# Patient Record
Sex: Female | Born: 1935 | Race: Black or African American | Hispanic: No | Smoking: Never smoker
Health system: Southern US, Community
[De-identification: ages and names within clinical notes are randomized; demographics above are authoritative.]

## PROBLEM LIST (undated history)

## (undated) DIAGNOSIS — K219 Gastro-esophageal reflux disease without esophagitis: Secondary | ICD-10-CM

## (undated) DIAGNOSIS — D649 Anemia, unspecified: Secondary | ICD-10-CM

## (undated) DIAGNOSIS — I10 Essential (primary) hypertension: Secondary | ICD-10-CM

## (undated) DIAGNOSIS — K922 Gastrointestinal hemorrhage, unspecified: Secondary | ICD-10-CM

## (undated) DIAGNOSIS — I4891 Unspecified atrial fibrillation: Secondary | ICD-10-CM

## (undated) DIAGNOSIS — M109 Gout, unspecified: Secondary | ICD-10-CM

## (undated) DIAGNOSIS — K7460 Unspecified cirrhosis of liver: Secondary | ICD-10-CM

---

## 2017-09-29 ENCOUNTER — Other Ambulatory Visit: Payer: Self-pay

## 2017-09-29 ENCOUNTER — Emergency Department (HOSPITAL_COMMUNITY): Payer: Medicare Other

## 2017-09-29 ENCOUNTER — Encounter (HOSPITAL_COMMUNITY): Payer: Self-pay

## 2017-09-29 ENCOUNTER — Inpatient Hospital Stay (HOSPITAL_COMMUNITY)
Admission: EM | Admit: 2017-09-29 | Discharge: 2017-10-02 | DRG: 194 | Disposition: A | Discharge status: Home / Self Care | Payer: Medicare Other | Attending: Internal Medicine | Admitting: Internal Medicine

## 2017-09-29 DIAGNOSIS — M109 Gout, unspecified: Secondary | ICD-10-CM | POA: Diagnosis present

## 2017-09-29 DIAGNOSIS — D649 Anemia, unspecified: Secondary | ICD-10-CM | POA: Diagnosis not present

## 2017-09-29 DIAGNOSIS — Z8711 Personal history of peptic ulcer disease: Secondary | ICD-10-CM | POA: Diagnosis not present

## 2017-09-29 DIAGNOSIS — I4891 Unspecified atrial fibrillation: Secondary | ICD-10-CM | POA: Diagnosis present

## 2017-09-29 DIAGNOSIS — K219 Gastro-esophageal reflux disease without esophagitis: Secondary | ICD-10-CM | POA: Diagnosis present

## 2017-09-29 DIAGNOSIS — N189 Chronic kidney disease, unspecified: Secondary | ICD-10-CM

## 2017-09-29 DIAGNOSIS — I129 Hypertensive chronic kidney disease with stage 1 through stage 4 chronic kidney disease, or unspecified chronic kidney disease: Secondary | ICD-10-CM | POA: Diagnosis present

## 2017-09-29 DIAGNOSIS — I509 Heart failure, unspecified: Secondary | ICD-10-CM

## 2017-09-29 DIAGNOSIS — R0600 Dyspnea, unspecified: Secondary | ICD-10-CM

## 2017-09-29 DIAGNOSIS — N184 Chronic kidney disease, stage 4 (severe): Secondary | ICD-10-CM | POA: Diagnosis present

## 2017-09-29 DIAGNOSIS — E8770 Fluid overload, unspecified: Secondary | ICD-10-CM | POA: Diagnosis present

## 2017-09-29 DIAGNOSIS — Y95 Nosocomial condition: Secondary | ICD-10-CM | POA: Diagnosis present

## 2017-09-29 DIAGNOSIS — D509 Iron deficiency anemia, unspecified: Secondary | ICD-10-CM | POA: Diagnosis present

## 2017-09-29 DIAGNOSIS — E785 Hyperlipidemia, unspecified: Secondary | ICD-10-CM | POA: Diagnosis present

## 2017-09-29 DIAGNOSIS — R0902 Hypoxemia: Secondary | ICD-10-CM | POA: Diagnosis present

## 2017-09-29 DIAGNOSIS — I10 Essential (primary) hypertension: Secondary | ICD-10-CM | POA: Diagnosis not present

## 2017-09-29 DIAGNOSIS — I340 Nonrheumatic mitral (valve) insufficiency: Secondary | ICD-10-CM | POA: Diagnosis not present

## 2017-09-29 DIAGNOSIS — J189 Pneumonia, unspecified organism: Principal | ICD-10-CM

## 2017-09-29 DIAGNOSIS — I4892 Unspecified atrial flutter: Secondary | ICD-10-CM | POA: Diagnosis present

## 2017-09-29 DIAGNOSIS — K7460 Unspecified cirrhosis of liver: Secondary | ICD-10-CM | POA: Diagnosis present

## 2017-09-29 DIAGNOSIS — R609 Edema, unspecified: Secondary | ICD-10-CM | POA: Diagnosis not present

## 2017-09-29 DIAGNOSIS — N179 Acute kidney failure, unspecified: Secondary | ICD-10-CM | POA: Diagnosis present

## 2017-09-29 DIAGNOSIS — I350 Nonrheumatic aortic (valve) stenosis: Secondary | ICD-10-CM | POA: Diagnosis not present

## 2017-09-29 HISTORY — DX: Gout, unspecified: M10.9

## 2017-09-29 HISTORY — DX: Gastrointestinal hemorrhage, unspecified: K92.2

## 2017-09-29 HISTORY — DX: Unspecified cirrhosis of liver: K74.60

## 2017-09-29 HISTORY — DX: Essential (primary) hypertension: I10

## 2017-09-29 HISTORY — DX: Anemia, unspecified: D64.9

## 2017-09-29 HISTORY — DX: Gastro-esophageal reflux disease without esophagitis: K21.9

## 2017-09-29 LAB — LIPASE, BLOOD: Lipase: 33 U/L (ref 11–51)

## 2017-09-29 LAB — BRAIN NATRIURETIC PEPTIDE: B Natriuretic Peptide: 1096.3 pg/mL — ABNORMAL HIGH (ref 0.0–100.0)

## 2017-09-29 LAB — I-STAT TROPONIN, ED: TROPONIN I, POC: 0.02 ng/mL (ref 0.00–0.08)

## 2017-09-29 LAB — CBC
HCT: 21.9 % — ABNORMAL LOW (ref 36.0–46.0)
HEMOGLOBIN: 7 g/dL — AB (ref 12.0–15.0)
MCH: 30.2 pg (ref 26.0–34.0)
MCHC: 32 g/dL (ref 30.0–36.0)
MCV: 94.4 fL (ref 78.0–100.0)
Platelets: 192 10*3/uL (ref 150–400)
RBC: 2.32 MIL/uL — AB (ref 3.87–5.11)
RDW: 15.1 % (ref 11.5–15.5)
WBC: 11.1 10*3/uL — AB (ref 4.0–10.5)

## 2017-09-29 LAB — URINALYSIS, ROUTINE W REFLEX MICROSCOPIC
BILIRUBIN URINE: NEGATIVE
Glucose, UA: NEGATIVE mg/dL
Hgb urine dipstick: NEGATIVE
Ketones, ur: NEGATIVE mg/dL
Leukocytes, UA: NEGATIVE
NITRITE: NEGATIVE
Protein, ur: NEGATIVE mg/dL
SPECIFIC GRAVITY, URINE: 1.008 (ref 1.005–1.030)
pH: 6 (ref 5.0–8.0)

## 2017-09-29 LAB — BASIC METABOLIC PANEL
ANION GAP: 10 (ref 5–15)
BUN: 22 mg/dL — ABNORMAL HIGH (ref 6–20)
CALCIUM: 9.1 mg/dL (ref 8.9–10.3)
CO2: 22 mmol/L (ref 22–32)
Chloride: 106 mmol/L (ref 101–111)
Creatinine, Ser: 1.66 mg/dL — ABNORMAL HIGH (ref 0.44–1.00)
GFR, EST AFRICAN AMERICAN: 31 mL/min — AB (ref >60)
GFR, EST NON AFRICAN AMERICAN: 27 mL/min — AB (ref >60)
Glucose, Bld: 135 mg/dL — ABNORMAL HIGH (ref 65–99)
Potassium: 4.3 mmol/L (ref 3.5–5.1)
SODIUM: 138 mmol/L (ref 135–145)

## 2017-09-29 LAB — HEPATIC FUNCTION PANEL
ALT: 13 U/L — ABNORMAL LOW (ref 14–54)
AST: 31 U/L (ref 15–41)
Albumin: 3.1 g/dL — ABNORMAL LOW (ref 3.5–5.0)
Alkaline Phosphatase: 80 U/L (ref 38–126)
BILIRUBIN DIRECT: 0.2 mg/dL (ref 0.1–0.5)
Indirect Bilirubin: 0.3 mg/dL (ref 0.3–0.9)
Total Bilirubin: 0.5 mg/dL (ref 0.3–1.2)
Total Protein: 7.2 g/dL (ref 6.5–8.1)

## 2017-09-29 LAB — ABO/RH: ABO/RH(D): O POS

## 2017-09-29 LAB — APTT: aPTT: 37 seconds — ABNORMAL HIGH (ref 24–36)

## 2017-09-29 LAB — RETICULOCYTES
RBC.: 2.29 MIL/uL — ABNORMAL LOW (ref 3.87–5.11)
Retic Count, Absolute: 73.3 10*3/uL (ref 19.0–186.0)
Retic Ct Pct: 3.2 % — ABNORMAL HIGH (ref 0.4–3.1)

## 2017-09-29 LAB — POC OCCULT BLOOD, ED: Fecal Occult Bld: NEGATIVE

## 2017-09-29 LAB — PROTIME-INR
INR: 1.24
PROTHROMBIN TIME: 15.5 s — AB (ref 11.4–15.2)

## 2017-09-29 LAB — PREPARE RBC (CROSSMATCH)

## 2017-09-29 LAB — I-STAT CG4 LACTIC ACID, ED: LACTIC ACID, VENOUS: 1.8 mmol/L (ref 0.5–1.9)

## 2017-09-29 MED ORDER — VANCOMYCIN HCL IN DEXTROSE 750-5 MG/150ML-% IV SOLN: 750.0000 mg | INTRAVENOUS | Status: DC

## 2017-09-29 MED ORDER — METOPROLOL TARTRATE 12.5 MG HALF TABLET
12.5000 mg | ORAL_TABLET | Freq: Two times a day (BID) | ORAL | Status: DC
  Administered 2017-09-30 – 2017-10-02 (×5): 12.5 mg via ORAL
  Filled 2017-09-29 (×6): qty 1

## 2017-09-29 MED ORDER — METHYLPREDNISOLONE SODIUM SUCC 125 MG IJ SOLR
125.0000 mg | Freq: Once | INTRAMUSCULAR | Status: AC
  Administered 2017-09-29: 125 mg via INTRAVENOUS
  Filled 2017-09-29: qty 2

## 2017-09-29 MED ORDER — PANTOPRAZOLE SODIUM 40 MG PO TBEC
40.0000 mg | DELAYED_RELEASE_TABLET | Freq: Two times a day (BID) | ORAL | Status: DC
  Administered 2017-09-30 – 2017-10-02 (×5): 40 mg via ORAL
  Filled 2017-09-29 (×5): qty 1

## 2017-09-29 MED ORDER — IOPAMIDOL (ISOVUE-370) INJECTION 76%
INTRAVENOUS | Status: AC
  Filled 2017-09-29: qty 100

## 2017-09-29 MED ORDER — ATORVASTATIN CALCIUM 40 MG PO TABS
40.0000 mg | ORAL_TABLET | Freq: Every day | ORAL | Status: DC
  Administered 2017-09-30 – 2017-10-01 (×2): 40 mg via ORAL
  Filled 2017-09-29 (×2): qty 1

## 2017-09-29 MED ORDER — ALBUTEROL SULFATE (2.5 MG/3ML) 0.083% IN NEBU: 2.5000 mg | INHALATION_SOLUTION | RESPIRATORY_TRACT | Status: DC | PRN

## 2017-09-29 MED ORDER — IOPAMIDOL (ISOVUE-300) INJECTION 61%: 75.0000 mL | Freq: Once | INTRAVENOUS | Status: DC | PRN

## 2017-09-29 MED ORDER — IOPAMIDOL (ISOVUE-370) INJECTION 76%
75.0000 mL | Freq: Once | INTRAVENOUS | Status: AC | PRN
Start: 2017-09-29 — End: 2017-09-29
  Administered 2017-09-29: 75 mL via INTRAVENOUS

## 2017-09-29 MED ORDER — PIPERACILLIN SOD-TAZOBACTAM SO 2.25 (2-0.25) G IV SOLR
2.2750 g | Freq: Four times a day (QID) | INTRAVENOUS | Status: DC
  Filled 2017-09-29 (×2): qty 2.27

## 2017-09-29 MED ORDER — SODIUM CHLORIDE 0.9 % IV SOLN
500.0000 mg | Freq: Once | INTRAVENOUS | Status: AC
  Administered 2017-09-29: 500 mg via INTRAVENOUS
  Filled 2017-09-29: qty 500

## 2017-09-29 MED ORDER — ENOXAPARIN SODIUM 30 MG/0.3ML ~~LOC~~ SOLN
30.0000 mg | SUBCUTANEOUS | Status: DC
  Administered 2017-09-29 – 2017-10-01 (×3): 30 mg via SUBCUTANEOUS
  Filled 2017-09-29 (×3): qty 0.3

## 2017-09-29 MED ORDER — PIPERACILLIN-TAZOBACTAM 3.375 G IVPB 30 MIN
3.3750 g | Freq: Once | INTRAVENOUS | Status: AC
  Administered 2017-09-29: 3.375 g via INTRAVENOUS
  Filled 2017-09-29: qty 50

## 2017-09-29 MED ORDER — SODIUM CHLORIDE 0.9 % IV SOLN
10.0000 mL/h | Freq: Once | INTRAVENOUS | Status: AC
Start: 2017-09-29 — End: 2017-09-29
  Administered 2017-09-29: 10 mL/h via INTRAVENOUS

## 2017-09-29 MED ORDER — AMLODIPINE BESYLATE 5 MG PO TABS
5.0000 mg | ORAL_TABLET | Freq: Every day | ORAL | Status: DC
  Administered 2017-09-29 – 2017-10-01 (×3): 5 mg via ORAL
  Filled 2017-09-29 (×3): qty 1

## 2017-09-29 MED ORDER — PIPERACILLIN-TAZOBACTAM IN DEX 2-0.25 GM/50ML IV SOLN
2.2500 g | Freq: Four times a day (QID) | INTRAVENOUS | Status: DC
  Administered 2017-09-30: 2.25 g via INTRAVENOUS
  Filled 2017-09-29 (×4): qty 50

## 2017-09-29 MED ORDER — VANCOMYCIN HCL IN DEXTROSE 1-5 GM/200ML-% IV SOLN
1000.0000 mg | Freq: Once | INTRAVENOUS | Status: AC
  Administered 2017-09-29: 1000 mg via INTRAVENOUS
  Filled 2017-09-29: qty 200

## 2017-09-29 MED ORDER — SODIUM CHLORIDE 0.9 % IV BOLUS (SEPSIS)
1000.0000 mL | Freq: Once | INTRAVENOUS | Status: AC
  Administered 2017-09-29: 1000 mL via INTRAVENOUS

## 2017-09-29 MED ORDER — FUROSEMIDE 10 MG/ML IJ SOLN
40.0000 mg | Freq: Once | INTRAMUSCULAR | Status: AC
  Administered 2017-09-29: 40 mg via INTRAVENOUS
  Filled 2017-09-29: qty 4

## 2017-09-29 MED ORDER — ALBUTEROL SULFATE (2.5 MG/3ML) 0.083% IN NEBU
5.0000 mg | INHALATION_SOLUTION | Freq: Once | RESPIRATORY_TRACT | Status: AC
  Administered 2017-09-29: 5 mg via RESPIRATORY_TRACT
  Filled 2017-09-29: qty 6

## 2017-09-29 MED ORDER — IPRATROPIUM BROMIDE 0.02 % IN SOLN
0.5000 mg | Freq: Once | RESPIRATORY_TRACT | Status: AC
  Administered 2017-09-29: 0.5 mg via RESPIRATORY_TRACT
  Filled 2017-09-29: qty 2.5

## 2017-09-29 NOTE — ED Notes (Signed)
Per RN,Oscar hold lab draw at this time.

## 2017-09-29 NOTE — ED Triage Notes (Signed)
Pt reports feeling weak for 1 week. Dyspnea with exertion. Denies chest pain or nausea.Denies numbness or tingling. No unilateral symptoms or facial droop. A&Ox4.

## 2017-09-29 NOTE — ED Provider Notes (Signed)
[facility]-EMERGENCY DEPT Provider Note   CSN: [id number] Arrival date & time: 09/29/17  1221     History   Chief Complaint Chief Complaint  Patient presents with  . Weakness    HPI Nichole Padilla is a 83 y.o. female here presenting with shortness of breath with exertion, cough, weakness.  Patient states that for the last week or so she has been having shortness of breath with exertion and associated with some nausea.  Patient just feels weak all over as well.  Patient has some noncombative cough and has some wheezing as well.  Denies any actual fevers.  Of note, patient was [facility] for symptomatic anemia and had a colonoscopy that was unremarkable.  She was thought to have bleeding secondary to colchicine use for her gout.  She denies any melena currently and denies any NSAID use.   The history is provided by the patient.    History reviewed. No pertinent past medical history.  There are no active problems to display for this patient.   No past surgical history on file.  OB History    No data available       Home Medications    Prior to Admission medications   Not on File    Family History History reviewed. No pertinent family history.  Social History Social History   Tobacco Use  . Smoking status: Not on file  Substance Use Topics  . Alcohol use: Not on file  . Drug use: Not on file     Allergies   Patient has no known allergies.   Review of Systems Review of Systems  Respiratory: Positive for shortness of breath.   Neurological: Positive for weakness.  All other systems reviewed and are negative.    Physical Exam Updated Vital Signs BP 139/66 (BP Location: Left Arm)   Pulse 95   Temp 98.8 F (37.1 C) (Oral)   Resp 16   Ht 5\' 3"  (1.6 m)   Wt 61.2 kg (135 lb)   SpO2 96%   BMI 23.91 kg/m   Physical Exam  Constitutional: She is oriented to person, place, and time.  Chronically ill,  pale   HENT:  Head: Normocephalic.  Mouth/Throat: Oropharynx is clear and moist.  Eyes: Conjunctivae and EOM are normal. Pupils are equal, round, and reactive to light.  Neck: Normal range of motion. Neck supple.  Cardiovascular: Normal rate, regular rhythm and normal heart sounds.  Pulmonary/Chest:  Wheezing throughout, worse on R side   Abdominal: Soft. Bowel sounds are normal. She exhibits no distension. There is no tenderness.  Musculoskeletal:  1+ edema bilaterally   Neurological: She is alert and oriented to person, place, and time.  Skin: Skin is warm.  Psychiatric: She has a normal mood and affect.  Nursing note and vitals reviewed.    ED Treatments / Results  Labs (all labs ordered are listed, but only abnormal results are displayed) Labs Reviewed  BASIC METABOLIC PANEL - Abnormal; Notable for the following components:      Result Value   Glucose, Bld 135 (*)    BUN 22 (*)    Creatinine, Ser 1.66 (*)    GFR calc non Af Amer 27 (*)    GFR calc Af Amer 31 (*)    All other components within normal limits  CBC - Abnormal; Notable for the following components:   WBC 11.1 (*)    RBC 2.32 (*)    Hemoglobin  7.0 (*)    HCT 21.9 (*)    All other components within normal limits  URINALYSIS, ROUTINE W REFLEX MICROSCOPIC  HEPATIC FUNCTION PANEL  LIPASE, BLOOD  VITAMIN B12  FOLATE  IRON AND TIBC  FERRITIN  RETICULOCYTES  I-STAT TROPONIN, ED  POC OCCULT BLOOD, ED  TYPE AND SCREEN  PREPARE RBC (CROSSMATCH)    EKG  EKG Interpretation  Date/Time:  Sunday September 29 2017 14:02:12 EST Ventricular Rate:  105 PR Interval:    QRS Duration: 56 QT Interval:  440 QTC Calculation: 582 R Axis:   65 Text Interpretation:  Atrial fibrillation Low voltage, precordial leads Borderline T abnormalities, diffuse leads Prolonged QT interval Baseline wander in lead(s) I III aVR aVL aVF V5 No previous ECGs available Confirmed by Richardean CanalYao, Minie Roadcap H [phone]([id number]) on 09/29/2017 5:03:40 PM        Radiology No results found.  Procedures Procedures (including critical care time)  CRITICAL CARE Performed by: Richardean Canalavid H Tanveer Dobberstein   Total critical care time: 30 minutes  Critical care time was exclusive of separately billable procedures and treating other patients.  Critical care was necessary to treat or prevent imminent or life-threatening deterioration.  Critical care was time spent personally by me on the following activities: development of treatment plan with patient and/or surrogate as well as nursing, discussions with consultants, evaluation of patient's response to treatment, examination of patient, obtaining history from patient or surrogate, ordering and performing treatments and interventions, ordering and review of laboratory studies, ordering and review of radiographic studies, pulse oximetry and re-evaluation of patient's condition.   Medications Ordered in ED Medications  sodium chloride 0.9 % bolus 1,000 mL (not administered)  albuterol (PROVENTIL) (2.5 MG/3ML) 0.083% nebulizer solution 5 mg (not administered)  ipratropium (ATROVENT) nebulizer solution 0.5 mg (not administered)  methylPREDNISolone sodium succinate (SOLU-MEDROL) 125 mg/2 mL injection 125 mg (not administered)  0.9 %  sodium chloride infusion (not administered)     Initial Impression / Assessment and Plan / ED Course  I have reviewed the triage vital signs and the nursing notes.  Pertinent labs & imaging results that were available during my care of the patient were reviewed by me and considered in my medical decision making (see chart for details).     Nichole Padilla is a 83 y.o. female here with shortness of breath. Recently admitted for anemia and had nl colonoscopy recently. Consider symptomatic anemia vs COPD vs pneumonia.   8:05 PM CXR showed multifocal pneumonia. She does have some coughing but no fevers. CTA showed no obvious PE, but there is multifocal pneumonia with pleural effusions.  BNP 1000. WBC 13, lactate nl. Cr 1.6, no baseline. Given lasix, vanc/zosyn for HCAP. Hg 7.0, ordered 1 U PRBC, occ negative, anemia panel sent.    Final Clinical Impressions(s) / ED Diagnoses   Final diagnoses:  None    ED Discharge Orders    None       Charlynne PanderYao, Kindel Rochefort Hsienta, MD 09/29/17 2007

## 2017-09-29 NOTE — H&P (Addendum)
History and Physical    Nichole Padilla ZOX:[id number] DOB: May 24, 1927 DOA: 09/29/2017  PCP: Patient, No Pcp Per Patient coming from: Home  Chief Complaint: Short of breath  HPI: Nichole Padilla is a 83 y.o. female with medical history significant of hypertension, gout and recent hospitalization in [place] for symptomatic anemia from suspected upper GI source who presents with 1 week of worsening exertional dyspnea, cough and fatigue. Patient states that for the past 3 weeks, she has experienced self-described virus with cough productive of "cream-colored" sputum, wheezing, chills, pleurisy and fatigue. She denies chest pain or chest tightness. Denies nausea, vomiting, diarrhea, urinary symptoms. She states that she has felt better over the last week, with improvement in her "viral symptoms," however the shortness of breath has worsened. She also notes mild increased swelling in her lower extremities, but she does admit to chronic lower extremity edema. She is a lifelong nonsmoker. No sick contacts at home. She was brought in by her daughter and grandson. Patient's daughter is her primary caretaker and organizes the patient's medications. Nichole Padilla ambulates without assistance at home.  In regards to recent hospitalization, the patient reports that she was admitted to [facility] approximately one month ago for a short period of time for apparent symptomatic anemia. The exact duration of the hospitalization is unclear, however the patient states that it was less than 1 day. She received 2 units of packed red blood cells along with upper endoscopy and colonoscopy, both of which were negative per the patient. Capsule endoscopy was also performed, which was also reportedly negative.  ED Course: In the ED, patient afebrile, mildly tachycardic and normotensive, saturating comfortably on room air. Labs notable for WBC 11.1, Hgb 7.0, platelets 192, BUN 22, CR 1.66, BNP 10 96, initial troponin 0.02. UA  wholly unremarkable. FOBT negative for occult blood. Chest x-ray showed right upper and right middle lobe opacities concerning for multifocal pneumonia. CT PE was obtained, which was negative for PE, but did demonstrate multifocal groundglass opacities, most prominent in the right upper right middle lobes with small bilateral pleural effusions right greater than left. Patient was given 1 unit of packed red blood cells, empiric antibiotics, Lasix IV 1 and nebulizer treatment prior to admission to the floor.  Review of Systems: As per HPI otherwise 10 point review of systems negative.   Past Medical History:  Diagnosis Date  . Anemia   . Gout   . Hypertension   . Upper GI bleed     History reviewed. No pertinent surgical history.   reports that  has never smoked. she has never used smokeless tobacco. Her alcohol and drug histories are not on file.  No Known Allergies  History reviewed. No pertinent family history.  Prior to Admission medications   Medication Sig Start Date End Date Taking? Authorizing Provider  amLODipine (NORVASC) 5 MG tablet Take 5 mg daily by mouth.   Yes [provider]  Apoaequorin (PREVAGEN EXTRA STRENGTH) 20 MG CAPS Take 20 mg daily by mouth.   Yes [provider]  Cholecalciferol (VITAMIN D3) 2000 units TABS Take 1 tablet daily by mouth.   Yes [provider]  furosemide (LASIX) 20 MG tablet Take 20 mg daily as needed by mouth for fluid or edema.   Yes [provider]  losartan (COZAAR) 100 MG tablet Take 100 mg daily by mouth.   Yes [provider]  Multiple Vitamin (MULTIVITAMIN WITH MINERALS) TABS tablet Take 1 tablet daily by mouth.   Yes [provider]  omeprazole (PRILOSEC) 20 MG capsule Take 20 mg daily by mouth.   Yes [provider]  traMADol-acetaminophen (ULTRACET) 37.5-325 MG tablet Take 1 tablet 2 (two) times daily as needed by mouth for moderate pain.   Yes [provider]     Physical Exam: Vitals:   09/29/17 1248 09/29/17 1825 09/29/17 2138  BP: 139/66 (!) 169/69 (!) 163/80  Pulse: 95 91 (!) 113  Resp: 16 14 16   Temp: 98.8 F (37.1 C)  98.2 F (36.8 C)  TempSrc: Oral  Oral  SpO2: 96% 100% 98%  Weight: 61.2 kg (135 lb)    Height: 5\' 3"  (1.6 m)      Constitutional: NAD, calm, comfortable Vitals:   09/29/17 1248 09/29/17 1825 09/29/17 2138  BP: 139/66 (!) 169/69 (!) 163/80  Pulse: 95 91 (!) 113  Resp: 16 14 16   Temp: 98.8 F (37.1 C)  98.2 F (36.8 C)  TempSrc: Oral  Oral  SpO2: 96% 100% 98%  Weight: 61.2 kg (135 lb)    Height: 5\' 3"  (1.6 m)     Eyes: PERRL, lids and conjunctivae normal ENMT: Mucous membranes are moist.  Neck: normal, supple, no masses, no thyromegaly Respiratory: inspiratory crackles in the RUL, RML; end-expiratory wheezing in bilateral upper lobes; breath sounds diminished at the bases bilaterally Cardiovascular: Irregular rhythm, tachycardic, no murmurs / rubs / gallops. 1+ pitting edema of BLE L>R Abdomen: no tenderness, no masses palpated. No hepatosplenomegaly. Bowel sounds positive.  Musculoskeletal: no clubbing / cyanosis. No joint deformity upper and lower extremities. Good ROM, no contractures. Normal muscle tone.  Skin: no rashes, lesions, ulcers. No induration Neurologic: CN 2-12 grossly intact. Sensation intact, DTR normal. Strength 5/5 in all 4.  Psychiatric: Normal judgment and insight. Alert and oriented x 3. Normal mood.   Labs on Admission: I have personally reviewed following labs and imaging studies  CBC: Recent Labs  Lab 09/29/17 1453  WBC 11.1*  HGB 7.0*  HCT 21.9*  MCV 94.4  PLT 192   Basic Metabolic Panel: Recent Labs  Lab 09/29/17 1453  NA 138  K 4.3  CL 106  CO2 22  GLUCOSE 135*  BUN 22*  CREATININE 1.66*  CALCIUM 9.1   GFR: Estimated Creatinine Clearance: 19.8 mL/min (A) (by C-G formula based on SCr of 1.66 mg/dL (H)). Liver Function Tests: Recent Labs  Lab 09/29/17 1740   AST 31  ALT 13*  ALKPHOS 80  BILITOT 0.5  PROT 7.2  ALBUMIN 3.1*   Recent Labs  Lab 09/29/17 1740  LIPASE 33   No results for input(s): AMMONIA in the last 168 hours. Coagulation Profile: No results for input(s): INR, PROTIME in the last 168 hours. Cardiac Enzymes: No results for input(s): CKTOTAL, CKMB, CKMBINDEX, TROPONINI in the last 168 hours. BNP (last 3 results) No results for input(s): PROBNP in the last 8760 hours. HbA1C: No results for input(s): HGBA1C in the last 72 hours. CBG: No results for input(s): GLUCAP in the last 168 hours. Lipid Profile: No results for input(s): CHOL, HDL, LDLCALC, TRIG, CHOLHDL, LDLDIRECT in the last 72 hours. Thyroid Function Tests: No results for input(s): TSH, T4TOTAL, FREET4, T3FREE, THYROIDAB in the last 72 hours. Anemia Panel: Recent Labs    09/29/17 1740  RETICCTPCT 3.2*   Urine analysis:    Component Value Date/Time   COLORURINE YELLOW 09/29/2017 1354   APPEARANCEUR CLEAR 09/29/2017 1354   LABSPEC 1.008 09/29/2017 1354   PHURINE 6.0 09/29/2017 1354   GLUCOSEU NEGATIVE  09/29/2017 1354   HGBUR NEGATIVE 09/29/2017 1354   BILIRUBINUR NEGATIVE 09/29/2017 1354   KETONESUR NEGATIVE 09/29/2017 1354   PROTEINUR NEGATIVE 09/29/2017 1354   NITRITE NEGATIVE 09/29/2017 1354   LEUKOCYTESUR NEGATIVE 09/29/2017 1354    Radiological Exams on Admission: Dg Chest 2 View  Result Date: 09/29/2017 CLINICAL DATA:  83 y/o F; history of breast cancer with mastectomy 6 years ago. Patient presents with shortness of breath today. EXAM: CHEST  2 VIEW COMPARISON:  None. FINDINGS: Borderline cardiomegaly. Calcific aortic atherosclerosis. Right mastectomy changes. Mild S-shaped curvature of the spine. Loss of left acromial humeral interval may represent rotator cuff injury. Patchy consolidations in the right upper and lower lobes. Clear left lung. Small right pleural effusion. IMPRESSION: 1. Patchy consolidations in the right upper and lower lobes  and small right effusion, possibly pneumonia or asymmetric pulmonary edema. Follow-up to resolution is recommended to exclude underlying malignancy. 2. Borderline cardiomegaly.  Aortic atherosclerosis. Electronically Signed   By: Mitzi HansenLance  Furusawa-Stratton M.D.   On: 09/29/2017 17:33   Ct Angio Chest Pe W And/or Wo Contrast  Result Date: 09/29/2017 CLINICAL DATA:  83 year old female with dyspnea and weakness. EXAM: CT ANGIOGRAPHY CHEST WITH CONTRAST TECHNIQUE: Multidetector CT imaging of the chest was performed using the standard protocol during bolus administration of intravenous contrast. Multiplanar CT image reconstructions and MIPs were obtained to evaluate the vascular anatomy. CONTRAST:  75mL ISOVUE-370 IOPAMIDOL (ISOVUE-370) INJECTION 76% COMPARISON:  Chest radiograph dated 09/29/2017 FINDINGS: Cardiovascular: Top-normal cardiac size. There is no pericardial effusion. Advanced multi vessel coronary vascular calcification as well as calcification of the mitral annulus. There is moderate atherosclerotic calcification of the thoracic aorta. No aneurysmal dilatation or evidence of dissection. Evaluation of the pulmonary artery is limited due to suboptimal opacification as well as respiratory motion artifact. Patient was initially intended to receive 75 cc of contrast however, she started to complain of burning sensation and pain at the site of the IV and contrast infusion was terminated. No central or large pulmonary artery embolus identified. Mediastinum/Nodes: There is no hilar adenopathy. Subcarinal lymph node measures 18 mm. Multiple top-normal mediastinal lymph nodes noted. Debris within the distal esophagus, likely gastroesophageal reflux or delayed esophageal clearance due to dysmotility. The thyroid gland is grossly unremarkable. Lungs/Pleura: Patchy areas of ground-glass airspace opacity noted in both lungs, right greater than left most consistent with multifocal pneumonia. Clinical correlation and  follow-up to resolution recommended. There are small bilateral pleural effusions, right greater left. No pneumothorax. The central airways are patent. Upper Abdomen: There is irregular appearance of the liver contour suggestive of cirrhosis. The visualized upper abdomen is otherwise unremarkable. Musculoskeletal: There is osteopenia with degenerative changes of the spine. Old-appearing mild T5 compression deformity with central depression. T12 superior endplate compression deformity also appears old. No retropulsed fragment. No definite acute fracture. Review of the MIP images confirms the above findings. IMPRESSION: 1. Limited evaluation of the pulmonary vasculature. No definite CT evidence of central or large pulmonary artery embolus. 2. Multifocal pneumonia clinical correlation and follow-up to resolution recommended. 3. Small bilateral pleural effusions, right greater than left. 4. Coronary vascular calcification and Aortic Atherosclerosis (ICD10-I70.0). Electronically Signed   By: Elgie CollardArash  Radparvar M.D.   On: 09/29/2017 19:33    EKG: Independently reviewed. Heart rate 105. Normal axis. Baseline rhythm appears to be flutter with variable conduction; difficult to assess given poor quality. Does not appear to be atrial fibrillation.  Assessment/Plan Active Problems:   Healthcare-associated pneumonia  HCAP, concern for post viral pneumonia  Bilateral pleural effusions Given recent hospitalization of uncertain duration and impressive CT findings, it is reasonable to treat patient as CAP with MDR risk factors. Patient reports recent respiratory viral syndrome preceding her current symptoms. Some additional evidence of volume overload (bilateral pleural effusions) and BNP elevation. Cannot rule out parapneumonic effusion at this time. Respiratory status is stable at the time of admission. - Broad spectrum empiric Abx - Follow up blood cxs, obtain sputum culture - Consider thoracentesis - Obtain RVP, Strep  pneumo/Legionella urine Ag - Check procalcitonin - Trend trop to peak - Obtain TTE - Daily CBC, trend fever curve  Afib/Flutter CHADS-VASC >3 with minimal history obtained. No known h/o arrhythmia per patient. - Hold on A/C given concern for recent GIB - Start low dose beta blockade - Risk stratification labs: TSH, A1c, FLP - Start statin, holding aspirin as above - TTE as above - Monitor on telemetry - Replete lytes PRN   Normocytic anemia Recent hospitalization for suspected UGIB - s/p 1 U PRBC in ED, FOBT negative - Check iron, B12, folate, hemolysis labs - [facility] - Continue PPI BID - Trend Hgb, transfusion threshold <7  Possible AKI Baseline Cr unknown. U/A showed no abnormalities. - Blood transfusion as above - Would diurese given evidence of hypervolemia - Contrast unfortunately given in ED - Strict I/O, daily weights - Avoid further nephrotoxins - Daily BMP, Mg, PO4  HTN - Continue home amlodipine - IV Lasix as above - Hold losartan given questionable AKI  DVT prophylaxis: Lovenox Code Status: Full Family Communication: Nichole Padilla (daughter), [phone] Disposition Plan: Anticipate 2-5 days for treatment of HCAP Consults called: None Admission status: Inpatient   Marcelo BaldyAdam Ross Kristoph Sattler MD [facility] Pager [phone]  If 7PM-7AM, please contact night-coverage [email or web address] Password [email or web address]  09/29/2017, 10:03 PM

## 2017-09-29 NOTE — Progress Notes (Signed)
Pharmacy Antibiotic Note  Nita SellsConstance Butikofer is a 83 y.o. female with SOB, cough and weakness admitted on 09/29/2017 with pneumonia.  Pharmacy has been consulted for vancomycin dosing.  Plan: Vancomycin 1 gm x1 + 500 mg x1 = 1500 mg load then 750 mg IV q48h for AUC=453 Goal AUC 400-500 Zosyn 3.375 Gm x1 then 2.25 Gm IV q6h (rx adjusted for renal function) Daily Scr F/u cultures/levels  Height: 5\' 3"  (160 cm) Weight: 135 lb (61.2 kg) IBW/kg (Calculated) : 52.4  Temp (24hrs), Avg:98.8 F (37.1 C), Min:98.8 F (37.1 C), Max:98.8 F (37.1 C)  Recent Labs  Lab 09/29/17 1453 09/29/17 1813  WBC 11.1*  --   CREATININE 1.66*  --   LATICACIDVEN  --  1.80    Estimated Creatinine Clearance: 19.8 mL/min (A) (by C-G formula based on SCr of 1.66 mg/dL (H)).    No Known Allergies  Antimicrobials this admission: 11/18 zosyn >>  11/18 vancomycin >>   Dose adjustments this admission:   Microbiology results:  BCx:   UCx:    Sputum:    MRSA PCR:   Thank you for allowing pharmacy to be a part of this patient's care.  Lorenza EvangelistGreen, Halah Whiteside R 09/29/2017 9:26 PM

## 2017-09-29 NOTE — ED Notes (Signed)
Called lab to f/u on the ordered BNP, lab tech indicated they are working on it despite not showing as processing.

## 2017-09-30 ENCOUNTER — Inpatient Hospital Stay (HOSPITAL_COMMUNITY): Payer: Medicare Other

## 2017-09-30 ENCOUNTER — Encounter (HOSPITAL_COMMUNITY): Payer: Self-pay | Admitting: Internal Medicine

## 2017-09-30 DIAGNOSIS — K219 Gastro-esophageal reflux disease without esophagitis: Secondary | ICD-10-CM

## 2017-09-30 DIAGNOSIS — N179 Acute kidney failure, unspecified: Secondary | ICD-10-CM

## 2017-09-30 DIAGNOSIS — I350 Nonrheumatic aortic (valve) stenosis: Secondary | ICD-10-CM

## 2017-09-30 DIAGNOSIS — I10 Essential (primary) hypertension: Secondary | ICD-10-CM

## 2017-09-30 DIAGNOSIS — R609 Edema, unspecified: Secondary | ICD-10-CM

## 2017-09-30 DIAGNOSIS — M109 Gout, unspecified: Secondary | ICD-10-CM | POA: Diagnosis present

## 2017-09-30 DIAGNOSIS — I340 Nonrheumatic mitral (valve) insufficiency: Secondary | ICD-10-CM

## 2017-09-30 DIAGNOSIS — D649 Anemia, unspecified: Secondary | ICD-10-CM | POA: Diagnosis present

## 2017-09-30 DIAGNOSIS — I4891 Unspecified atrial fibrillation: Secondary | ICD-10-CM

## 2017-09-30 DIAGNOSIS — J189 Pneumonia, unspecified organism: Secondary | ICD-10-CM

## 2017-09-30 HISTORY — DX: Gastro-esophageal reflux disease without esophagitis: K21.9

## 2017-09-30 LAB — RESPIRATORY PANEL BY PCR
Adenovirus: NOT DETECTED
Bordetella pertussis: NOT DETECTED
CORONAVIRUS OC43-RVPPCR: NOT DETECTED
Chlamydophila pneumoniae: NOT DETECTED
Coronavirus 229E: NOT DETECTED
Coronavirus HKU1: NOT DETECTED
Coronavirus NL63: NOT DETECTED
INFLUENZA A-RVPPCR: NOT DETECTED
INFLUENZA B-RVPPCR: NOT DETECTED
METAPNEUMOVIRUS-RVPPCR: NOT DETECTED
Mycoplasma pneumoniae: NOT DETECTED
PARAINFLUENZA VIRUS 1-RVPPCR: NOT DETECTED
PARAINFLUENZA VIRUS 2-RVPPCR: NOT DETECTED
PARAINFLUENZA VIRUS 3-RVPPCR: NOT DETECTED
PARAINFLUENZA VIRUS 4-RVPPCR: NOT DETECTED
RESPIRATORY SYNCYTIAL VIRUS-RVPPCR: NOT DETECTED
Rhinovirus / Enterovirus: NOT DETECTED

## 2017-09-30 LAB — EXPECTORATED SPUTUM ASSESSMENT W GRAM STAIN, RFLX TO RESP C

## 2017-09-30 LAB — MAGNESIUM: Magnesium: 1.9 mg/dL (ref 1.7–2.4)

## 2017-09-30 LAB — TROPONIN I
Troponin I: 0.04 ng/mL (ref <0.03)
Troponin I: 0.03 ng/mL (ref <0.03)

## 2017-09-30 LAB — BASIC METABOLIC PANEL
Anion gap: 11 (ref 5–15)
BUN: 21 mg/dL — AB (ref 6–20)
CO2: 20 mmol/L — ABNORMAL LOW (ref 22–32)
Calcium: 8.6 mg/dL — ABNORMAL LOW (ref 8.9–10.3)
Chloride: 102 mmol/L (ref 101–111)
Creatinine, Ser: 1.74 mg/dL — ABNORMAL HIGH (ref 0.44–1.00)
GFR calc Af Amer: 29 mL/min — ABNORMAL LOW (ref >60)
GFR, EST NON AFRICAN AMERICAN: 25 mL/min — AB (ref >60)
Glucose, Bld: 197 mg/dL — ABNORMAL HIGH (ref 65–99)
POTASSIUM: 4.3 mmol/L (ref 3.5–5.1)
Sodium: 133 mmol/L — ABNORMAL LOW (ref 135–145)

## 2017-09-30 LAB — LIPID PANEL
Cholesterol: 169 mg/dL (ref 0–200)
HDL: 49 mg/dL (ref >40)
LDL CALC: 103 mg/dL — AB (ref 0–99)
Total CHOL/HDL Ratio: 3.4 RATIO
Triglycerides: 86 mg/dL (ref <150)
VLDL: 17 mg/dL (ref 0–40)

## 2017-09-30 LAB — FERRITIN: Ferritin: 38 ng/mL (ref 11–307)

## 2017-09-30 LAB — CBC
HEMATOCRIT: 23.4 % — AB (ref 36.0–46.0)
Hemoglobin: 7.8 g/dL — ABNORMAL LOW (ref 12.0–15.0)
MCH: 29.4 pg (ref 26.0–34.0)
MCHC: 33.3 g/dL (ref 30.0–36.0)
MCV: 88.3 fL (ref 78.0–100.0)
Platelets: 175 10*3/uL (ref 150–400)
RBC: 2.65 MIL/uL — AB (ref 3.87–5.11)
RDW: 18.8 % — ABNORMAL HIGH (ref 11.5–15.5)
WBC: 7.4 10*3/uL (ref 4.0–10.5)

## 2017-09-30 LAB — IRON AND TIBC
IRON: 20 ug/dL — AB (ref 28–170)
SATURATION RATIOS: 5 % — AB (ref 10.4–31.8)
TIBC: 402 ug/dL (ref 250–450)
UIBC: 382 ug/dL

## 2017-09-30 LAB — PROCALCITONIN
PROCALCITONIN: 0.11 ng/mL
Procalcitonin: 0.15 ng/mL

## 2017-09-30 LAB — FOLATE: FOLATE: 33 ng/mL (ref >5.9)

## 2017-09-30 LAB — HIV ANTIBODY (ROUTINE TESTING W REFLEX): HIV SCREEN 4TH GENERATION: NONREACTIVE

## 2017-09-30 LAB — EXPECTORATED SPUTUM ASSESSMENT W REFEX TO RESP CULTURE: SPECIAL REQUESTS: NORMAL

## 2017-09-30 LAB — VITAMIN B12: VITAMIN B 12: 1027 pg/mL — AB (ref 180–914)

## 2017-09-30 LAB — TSH: TSH: 1.168 u[IU]/mL (ref 0.350–4.500)

## 2017-09-30 LAB — HEMOGLOBIN A1C
HEMOGLOBIN A1C: 5.9 % — AB (ref 4.8–5.6)
Mean Plasma Glucose: 122.63 mg/dL

## 2017-09-30 LAB — PHOSPHORUS: Phosphorus: 3.7 mg/dL (ref 2.5–4.6)

## 2017-09-30 MED ORDER — LEVALBUTEROL HCL 0.63 MG/3ML IN NEBU: 0.6300 mg | INHALATION_SOLUTION | Freq: Three times a day (TID) | RESPIRATORY_TRACT | Status: DC

## 2017-09-30 MED ORDER — LEVALBUTEROL HCL 0.63 MG/3ML IN NEBU
0.6300 mg | INHALATION_SOLUTION | Freq: Three times a day (TID) | RESPIRATORY_TRACT | Status: DC
  Administered 2017-09-30 – 2017-10-02 (×5): 0.63 mg via RESPIRATORY_TRACT
  Filled 2017-09-30 (×5): qty 3

## 2017-09-30 MED ORDER — ENSURE ENLIVE PO LIQD
237.0000 mL | Freq: Two times a day (BID) | ORAL | Status: DC
  Administered 2017-09-30 – 2017-10-02 (×5): 237 mL via ORAL

## 2017-09-30 MED ORDER — GUAIFENESIN ER 600 MG PO TB12
1200.0000 mg | ORAL_TABLET | Freq: Two times a day (BID) | ORAL | Status: DC
  Administered 2017-10-01 – 2017-10-02 (×4): 1200 mg via ORAL
  Filled 2017-09-30 (×4): qty 2

## 2017-09-30 MED ORDER — PIPERACILLIN-TAZOBACTAM IN DEX 2-0.25 GM/50ML IV SOLN
2.2500 g | Freq: Three times a day (TID) | INTRAVENOUS | Status: DC
  Administered 2017-09-30 – 2017-10-02 (×6): 2.25 g via INTRAVENOUS
  Filled 2017-09-30 (×7): qty 50

## 2017-09-30 NOTE — Progress Notes (Signed)
  Echocardiogram 2D Echocardiogram has been performed.  [name], Nichole Padilla M 09/30/2017, 3:46 PM

## 2017-09-30 NOTE — Evaluation (Signed)
Physical Therapy Evaluation Patient Details Name: Nichole Padilla MRN: [id number] DOB: 01/01/1927 Today's Date: 09/30/2017   History of Present Illness  Nichole Padilla is a 83 y.o. female with medical history significant of hypertension, gout and recent hospitalization in [place] for symptomatic anemia from suspected upper GI source who presents with 1 week of worsening exertional dyspnea, cough and fatigue.   In the ED, mildly tachycardic and normotensive, saturating comfortably on room air. Labs notable for WBC 11.1, Hgb 7.0,  Chest x-ray showed right upper and right middle lobe opacities concerning for multifocal pneumonia. CT PE was obtained, which was negative for PE.   Clinical Impression  The patient is progressing well. Saturation post walking =96% on RA. Pt admitted with above diagnosis. Pt currently with functional limitations due to the deficits listed below (see PT Problem List).  Pt will benefit from skilled PT to increase their independence and safety with mobility to allow discharge to the venue listed below.       Follow Up Recommendations No PT follow up    Equipment Recommendations  None recommended by PT    Recommendations for Other Services       Precautions / Restrictions Precautions Precautions: Fall      Mobility  Bed Mobility Overal bed mobility: Independent                Transfers Overall transfer level: Needs assistance Equipment used: Rolling walker (2 wheeled);None Transfers: Sit to/from Stand Sit to Stand: Min guard            Ambulation/Gait Ambulation/Gait assistance: Min guard Ambulation Distance (Feet): 200 Feet Assistive device: Rolling walker (2 wheeled) Gait Pattern/deviations: Step-through pattern     General Gait Details: ambulated x 6' without RW  Stairs            Wheelchair Mobility    Modified Rankin (Stroke Patients Only)       Balance                                              Pertinent Vitals/Pain Pain Assessment: No/denies pain    Home Living Family/patient expects to be discharged to:: Private residence Living Arrangements: Alone Available Help at Discharge: Family Type of Home: House Home Access: Stairs to enter   Secretary/administratorntrance Stairs-Number of Steps: 3 Home Layout: Able to live on main level with bedroom/bathroom Home Equipment: Walker - 2 wheels;Cane - single point      Prior Function Level of Independence: Independent               Hand Dominance        Extremity/Trunk Assessment   Upper Extremity Assessment Upper Extremity Assessment: Generalized weakness    Lower Extremity Assessment Lower Extremity Assessment: Generalized weakness    Cervical / Trunk Assessment Cervical / Trunk Assessment: Normal  Communication   Communication: No difficulties  Cognition Arousal/Alertness: Awake/alert Behavior During Therapy: WFL for tasks assessed/performed Overall Cognitive Status: Within Functional Limits for tasks assessed                                        General Comments      Exercises     Assessment/Plan    PT Assessment Patient needs continued PT services  PT Problem List Decreased strength;Decreased  activity tolerance;Decreased mobility;Decreased safety awareness       PT Treatment Interventions DME instruction;Gait training;Functional mobility training;Therapeutic activities;Patient/family education    PT Goals (Current goals can be found in the Care Plan section)  Acute Rehab PT Goals Patient Stated Goal: to go home PT Goal Formulation: With patient/family Time For Goal Achievement: 10/14/17 Potential to Achieve Goals: Good    Frequency Min 3X/week   Barriers to discharge        Co-evaluation               AM-PAC PT "6 Clicks" Daily Activity  Outcome Measure Difficulty turning over in bed (including adjusting bedclothes, sheets and blankets)?: None Difficulty moving from lying  on back to sitting on the side of the bed? : None Difficulty sitting down on and standing up from a chair with arms (e.g., wheelchair, bedside commode, etc,.)?: A Little Help needed moving to and from a bed to chair (including a wheelchair)?: A Little Help needed walking in hospital room?: A Little Help needed climbing 3-5 steps with a railing? : A Little 6 Click Score: 20    End of Session   Activity Tolerance: Patient tolerated treatment well Patient left: in chair;with call bell/phone within reach;with family/visitor present Nurse Communication: Mobility status PT Visit Diagnosis: Unsteadiness on feet (R26.81)    Time: [id number] PT Time Calculation (min) (ACUTE ONLY): 18 min   Charges:   PT Evaluation $PT Eval Low Complexity: 1 Low     PT G Codes:        {Lenoard Helbert PT [phone]   Rada HayHill, Fatime Biswell Elizabeth 09/30/2017, 4:31 PM

## 2017-09-30 NOTE — Progress Notes (Signed)
Bilateral lower extremity venous duplex has been completed. Negative for DVT.  09/30/17 9:28 AM Olen CordialGreg Anwita Mencer RVT

## 2017-09-30 NOTE — Progress Notes (Signed)
PROGRESS NOTE    Nichole Padilla  WUJ:[id number] DOB: 04/27/1927 DOA: 09/29/2017 PCP: Patient, No Pcp Per    Brief Narrative:  Patient is a 83 year old female history of hypertension, gout, recent hospitalization and then also symptomatic anemia from suspected upper GI source after treatment for an acute gout flare presented to the ED with 1 week of worsening exertional shortness of breath, cough, fatigue.  CT chest and chest x-ray concerning for right sided multi global pneumonia.  Patient also noted to be in A. fib which is new onset, and acute renal failure.  Patient admitted and placed empirically on IV antibiotics.  2D echo ordered.  Renal ultrasound ordered.  Lower extremity Dopplers done negative.   Assessment & Plan:   Principal Problem:   Healthcare-associated pneumonia Active Problems:   A-fib (HCC)   ARF (acute renal failure) (HCC)   HTN (hypertension)   Anemia   Gout  1 healthcare associated pneumonia Patient admitted with exertional shortness of breath, fatigue, productive cough.  Chest x-ray CT chest negative for PE however concerning for multilobar pneumonia.  Patient has been pancultured and results pending.  But respiratory viral panel negative.  Urine Legionella antigen pending.  Urine pneumococcus antigen pending.  2D echo ordered.  Patient currently afebrile.  Leukocytosis trending down.  Continue empiric IV vancomycin and IV Zosyn.  Add Mucinex and Xopenex nebulizers.  Follow.  2.  New onset atrial fibrillation Likely secondary to problem #1.  Patient with no prior history of atrial fibrillation.CHA2DS2VASC > 3.  Cardiac enzymes minimally elevated.  2D echo pending.  Continue Lopressor for rate control.  Hold on anticoagulation as patient recently evaluated for probable upper GI bleed after treatment for an acute gout flare.  Awaiting endoscopy and hospital records from outside hospital.  Will likely need outpatient follow-up with cardiology.  3.  Acute renal  failure versus chronic kidney disease Labs unavailable on epic as this is patient is likely first, [facility].  Try to obtain hospital records from outside hospital.  Check a urine sodium, urine creatinine.  Renal ultrasound.  Urinalysis is unremarkable.  Hold ARB. Follow.  4.  Gout  #5 hypertension Continue Norvasc and Lopressor.  6.  Hyperlipidemia Continue statin.  7.  Gastroesophageal reflux disease Continue PPI.    DVT prophylaxis: SCDs Code Status: Full Family Communication: Updated patient and daughter at bedside. Disposition Plan: Home once medically stable with clinical improvement with resolution of hypoxia.   Consultants:   None  Procedures:   2D echo 09/30/2017 pending  Lower extremity Dopplers 09/30/2017  Renal ultrasound 09/30/2017  CT angiogram chest 09/29/2017  Chest x-ray 09/29/2017  Antimicrobials:  IV Zosyn 09/29/2018  IV vancomycin 09/29/2018   Subjective: Seen in bed.  Patient states she is feeling better than on admission however not at baseline.  No chest pain.  No bleeding.  Objective: Vitals:   09/30/17 0342 09/30/17 0520 09/30/17 0800 09/30/17 1332  BP: 140/62 134/67  126/74  Pulse: 88 91  84  Resp: 15 16  16   Temp: 98 F (36.7 C) 98 F (36.7 C)  98.5 F (36.9 C)  TempSrc: Oral Oral  Oral  SpO2: 97% 100%  100%  Weight:  93.9 kg (207 lb 0.2 oz) 63.1 kg (139 lb 1.8 oz)   Height:        Intake/Output Summary (Last 24 hours) at 09/30/2017 1717 Last data filed at 09/30/2017 1500 Gross per 24 hour  Intake 1120 ml  Output 600 ml  Net 520 ml  Filed Weights   09/29/17 1248 09/30/17 0520 09/30/17 0800  Weight: 61.2 kg (135 lb) 93.9 kg (207 lb 0.2 oz) 63.1 kg (139 lb 1.8 oz)    Examination:  General exam: Appears calm and comfortable  Respiratory system: Coarse breath sounds on the right greater than left.  No crackles.  No wheezing.  Respiratory effort normal. Cardiovascular system: S1 & S2 heard, RRR. No JVD,  murmurs, rubs, gallops or clicks. No pedal edema. Gastrointestinal system: Abdomen is nondistended, soft and nontender. No organomegaly or masses felt. Normal bowel sounds heard. Central nervous system: Alert and oriented. No focal neurological deficits. Extremities: Symmetric 5 x 5 power. Skin: No rashes, lesions or ulcers Psychiatry: Judgement and insight appear normal. Mood & affect appropriate.     Data Reviewed: I have personally reviewed following labs and imaging studies  CBC: Recent Labs  Lab 09/29/17 1453 09/30/17 0506  WBC 11.1* 7.4  HGB 7.0* 7.8*  HCT 21.9* 23.4*  MCV 94.4 88.3  PLT 192 175   Basic Metabolic Panel: Recent Labs  Lab 09/29/17 1453 09/29/17 2235 09/30/17 0506  NA 138  --  133*  K 4.3  --  4.3  CL 106  --  102  CO2 22  --  20*  GLUCOSE 135*  --  197*  BUN 22*  --  21*  CREATININE 1.66*  --  1.74*  CALCIUM 9.1  --  8.6*  MG  --  1.9  --   PHOS  --  3.7  --    GFR: Estimated Creatinine Clearance: 20.4 mL/min (A) (by C-G formula based on SCr of 1.74 mg/dL (H)). Liver Function Tests: Recent Labs  Lab 09/29/17 1740  AST 31  ALT 13*  ALKPHOS 80  BILITOT 0.5  PROT 7.2  ALBUMIN 3.1*   Recent Labs  Lab 09/29/17 1740  LIPASE 33   No results for input(s): AMMONIA in the last 168 hours. Coagulation Profile: Recent Labs  Lab 09/29/17 2235  INR 1.24   Cardiac Enzymes: Recent Labs  Lab 09/29/17 2235 09/30/17 0506  TROPONINI 0.04* 0.03*   BNP (last 3 results) No results for input(s): PROBNP in the last 8760 hours. HbA1C: Recent Labs    09/29/17 2235  HGBA1C 5.9*   CBG: No results for input(s): GLUCAP in the last 168 hours. Lipid Profile: Recent Labs    09/29/17 2235  CHOL 169  HDL 49  LDLCALC 103*  TRIG 86  CHOLHDL 3.4   Thyroid Function Tests: Recent Labs    09/29/17 2235  TSH 1.168   Anemia Panel: Recent Labs    09/29/17 1740  VITAMINB12 1,027*  FOLATE 33.0  FERRITIN 38  TIBC 402  IRON 20*  RETICCTPCT  3.2*   Sepsis Labs: Recent Labs  Lab 09/29/17 1813 09/29/17 2235 09/30/17 0506  PROCALCITON  --  0.15 0.11  LATICACIDVEN 1.80  --   --     Recent Results (from the past 240 hour(s))  Respiratory Panel by PCR     Status: None   Collection Time: 09/29/17  9:59 PM  Result Value Ref Range Status   Adenovirus NOT DETECTED NOT DETECTED Final   Coronavirus 229E NOT DETECTED NOT DETECTED Final   Coronavirus HKU1 NOT DETECTED NOT DETECTED Final   Coronavirus NL63 NOT DETECTED NOT DETECTED Final   Coronavirus OC43 NOT DETECTED NOT DETECTED Final   Metapneumovirus NOT DETECTED NOT DETECTED Final   Rhinovirus / Enterovirus NOT DETECTED NOT DETECTED Final   Influenza A NOT DETECTED  NOT DETECTED Final   Influenza B NOT DETECTED NOT DETECTED Final   Parainfluenza Virus 1 NOT DETECTED NOT DETECTED Final   Parainfluenza Virus 2 NOT DETECTED NOT DETECTED Final   Parainfluenza Virus 3 NOT DETECTED NOT DETECTED Final   Parainfluenza Virus 4 NOT DETECTED NOT DETECTED Final   Respiratory Syncytial Virus NOT DETECTED NOT DETECTED Final   Bordetella pertussis NOT DETECTED NOT DETECTED Final   Chlamydophila pneumoniae NOT DETECTED NOT DETECTED Final   Mycoplasma pneumoniae NOT DETECTED NOT DETECTED Final    Comment: Performed at [facility], [address] [place], [place] [address]  Culture, sputum-assessment     Status: None   Collection Time: 09/30/17  9:07 AM  Result Value Ref Range Status   Specimen Description SPUTUM  Final   Special Requests Normal  Final   Sputum evaluation   Final    Sputum specimen not acceptable for testing.  Please recollect.   INFORMED FRASER,B @ 1051 ON [id number] BY POTEAT,S   Report Status 09/30/2017 FINAL  Final         Radiology Studies: Dg Chest 2 View  Result Date: 09/29/2017 CLINICAL DATA:  83 y/o F; history of breast cancer with mastectomy 6 years ago. Patient presents with shortness of breath today. EXAM: CHEST  2 VIEW COMPARISON:  None.  FINDINGS: Borderline cardiomegaly. Calcific aortic atherosclerosis. Right mastectomy changes. Mild S-shaped curvature of the spine. Loss of left acromial humeral interval may represent rotator cuff injury. Patchy consolidations in the right upper and lower lobes. Clear left lung. Small right pleural effusion. IMPRESSION: 1. Patchy consolidations in the right upper and lower lobes and small right effusion, possibly pneumonia or asymmetric pulmonary edema. Follow-up to resolution is recommended to exclude underlying malignancy. 2. Borderline cardiomegaly.  Aortic atherosclerosis. Electronically Signed   By: Mitzi Hansen M.D.   On: 09/29/2017 17:33   Ct Angio Chest Pe W And/or Wo Contrast  Result Date: 09/29/2017 CLINICAL DATA:  83 year old female with dyspnea and weakness. EXAM: CT ANGIOGRAPHY CHEST WITH CONTRAST TECHNIQUE: Multidetector CT imaging of the chest was performed using the standard protocol during bolus administration of intravenous contrast. Multiplanar CT image reconstructions and MIPs were obtained to evaluate the vascular anatomy. CONTRAST:  75mL ISOVUE-370 IOPAMIDOL (ISOVUE-370) INJECTION 76% COMPARISON:  Chest radiograph dated 09/29/2017 FINDINGS: Cardiovascular: Top-normal cardiac size. There is no pericardial effusion. Advanced multi vessel coronary vascular calcification as well as calcification of the mitral annulus. There is moderate atherosclerotic calcification of the thoracic aorta. No aneurysmal dilatation or evidence of dissection. Evaluation of the pulmonary artery is limited due to suboptimal opacification as well as respiratory motion artifact. Patient was initially intended to receive 75 cc of contrast however, she started to complain of burning sensation and pain at the site of the IV and contrast infusion was terminated. No central or large pulmonary artery embolus identified. Mediastinum/Nodes: There is no hilar adenopathy. Subcarinal lymph node measures 18 mm.  Multiple top-normal mediastinal lymph nodes noted. Debris within the distal esophagus, likely gastroesophageal reflux or delayed esophageal clearance due to dysmotility. The thyroid gland is grossly unremarkable. Lungs/Pleura: Patchy areas of ground-glass airspace opacity noted in both lungs, right greater than left most consistent with multifocal pneumonia. Clinical correlation and follow-up to resolution recommended. There are small bilateral pleural effusions, right greater left. No pneumothorax. The central airways are patent. Upper Abdomen: There is irregular appearance of the liver contour suggestive of cirrhosis. The visualized upper abdomen is otherwise unremarkable. Musculoskeletal: There is osteopenia with  degenerative changes of the spine. Old-appearing mild T5 compression deformity with central depression. T12 superior endplate compression deformity also appears old. No retropulsed fragment. No definite acute fracture. Review of the MIP images confirms the above findings. IMPRESSION: 1. Limited evaluation of the pulmonary vasculature. No definite CT evidence of central or large pulmonary artery embolus. 2. Multifocal pneumonia clinical correlation and follow-up to resolution recommended. 3. Small bilateral pleural effusions, right greater than left. 4. Coronary vascular calcification and Aortic Atherosclerosis (ICD10-I70.0). Electronically Signed   By: Elgie CollardArash  Radparvar M.D.   On: 09/29/2017 19:33   [place] Renal  Result Date: 09/30/2017 CLINICAL DATA:  Acute renal failure EXAM: RENAL / URINARY TRACT ULTRASOUND COMPLETE COMPARISON:  None. FINDINGS: Right Kidney: Length: 7.3 cm. Echogenicity within normal limits. No mass or hydronephrosis visualized. Trace perinephric fluid is noted about the interpolar lateral aspect of the right kidney. Left Kidney: Length: 8.8 cm. Echogenicity within normal limits. No mass or hydronephrosis visualized. Bladder: Appears normal for degree of bladder distention. Other:  Small right pleural effusion. IMPRESSION: 1. No obstructive uropathy, nephrolithiasis or renal mass. Maintained cortical-medullary distinction of the kidneys. 2. Small right pleural effusion. 3. Nonspecific trace perinephric fluid is identified. Electronically Signed   By: Tollie Ethavid  Kwon M.D.   On: 09/30/2017 15:01        Scheduled Meds: . amLODipine  5 mg Oral Daily  . atorvastatin  40 mg Oral q1800  . enoxaparin (LOVENOX) injection  30 mg Subcutaneous Q24H  . feeding supplement (ENSURE ENLIVE)  237 mL Oral BID BM  . metoprolol tartrate  12.5 mg Oral BID  . pantoprazole  40 mg Oral BID AC   Continuous Infusions: . piperacillin-tazobactam (ZOSYN)  IV Stopped (09/30/17 1642)  . [START ON 10/01/2017] vancomycin       LOS: 1 day    Time spent: 35 minutes    Ramiro Harvestaniel Shamyra Farias, MD [facility] Pager [phone]  If 7PM-7AM, please contact night-coverage [email or web address] Password [email or web address] 09/30/2017, 5:17 PM

## 2017-09-30 NOTE — Care Management Note (Signed)
Case Management Note  Patient Details  Name: Nichole SellsConstance Padilla MRN: [id number] Date of Birth: Nov 29, 1926  Subjective/Objective:                  Anemia and pna  Action/Plan: Date: September 30, 2017 Nichole Padilla, BSN, [place], [place]  [phone] Chart and notes review for patient progress and needs. Will follow for case management and discharge needs. Next review date: [id number]  Expected Discharge Date:                  Expected Discharge Plan:  Home/Self Care  In-House Referral:     Discharge planning Services  CM Consult  Post Acute Care Choice:    Choice offered to:     DME Arranged:    DME Agency:     HH Arranged:    HH Agency:     Status of Service:  In process, will continue to follow  If discussed at Long Length of Stay Meetings, dates discussed:    Additional Comments:  Golda AcreDavis, Bravery Ketcham Lynn, RN 09/30/2017, 9:03 AM

## 2017-10-01 ENCOUNTER — Encounter (HOSPITAL_COMMUNITY): Payer: Self-pay | Admitting: Internal Medicine

## 2017-10-01 DIAGNOSIS — N179 Acute kidney failure, unspecified: Secondary | ICD-10-CM | POA: Clinically undetermined

## 2017-10-01 DIAGNOSIS — N189 Chronic kidney disease, unspecified: Secondary | ICD-10-CM

## 2017-10-01 DIAGNOSIS — K7460 Unspecified cirrhosis of liver: Secondary | ICD-10-CM

## 2017-10-01 HISTORY — DX: Unspecified cirrhosis of liver: K74.60

## 2017-10-01 LAB — SODIUM, URINE, RANDOM: Sodium, Ur: 14 mmol/L

## 2017-10-01 LAB — BASIC METABOLIC PANEL
ANION GAP: 11 (ref 5–15)
ANION GAP: 11 (ref 5–15)
BUN: 36 mg/dL — ABNORMAL HIGH (ref 6–20)
BUN: 40 mg/dL — ABNORMAL HIGH (ref 6–20)
CALCIUM: 8.4 mg/dL — AB (ref 8.9–10.3)
CO2: 22 mmol/L (ref 22–32)
CO2: 22 mmol/L (ref 22–32)
Calcium: 8.4 mg/dL — ABNORMAL LOW (ref 8.9–10.3)
Chloride: 101 mmol/L (ref 101–111)
Chloride: 102 mmol/L (ref 101–111)
Creatinine, Ser: 2.47 mg/dL — ABNORMAL HIGH (ref 0.44–1.00)
Creatinine, Ser: 2.64 mg/dL — ABNORMAL HIGH (ref 0.44–1.00)
GFR, EST AFRICAN AMERICAN: 19 mL/min — AB (ref >60)
GFR, EST AFRICAN AMERICAN: 18 mL/min — AB (ref >60)
GFR, EST NON AFRICAN AMERICAN: 16 mL/min — AB (ref >60)
GFR, EST NON AFRICAN AMERICAN: 15 mL/min — AB (ref >60)
GLUCOSE: 122 mg/dL — AB (ref 65–99)
GLUCOSE: 173 mg/dL — AB (ref 65–99)
POTASSIUM: 4.6 mmol/L (ref 3.5–5.1)
POTASSIUM: 4.2 mmol/L (ref 3.5–5.1)
SODIUM: 135 mmol/L (ref 135–145)
Sodium: 134 mmol/L — ABNORMAL LOW (ref 135–145)

## 2017-10-01 LAB — CBC
HEMATOCRIT: 23.2 % — AB (ref 36.0–46.0)
HEMOGLOBIN: 7.7 g/dL — AB (ref 12.0–15.0)
MCH: 29.4 pg (ref 26.0–34.0)
MCHC: 33.2 g/dL (ref 30.0–36.0)
MCV: 88.5 fL (ref 78.0–100.0)
Platelets: 174 10*3/uL (ref 150–400)
RBC: 2.62 MIL/uL — AB (ref 3.87–5.11)
RDW: 19.3 % — ABNORMAL HIGH (ref 11.5–15.5)
WBC: 15.1 10*3/uL — ABNORMAL HIGH (ref 4.0–10.5)

## 2017-10-01 LAB — CREATININE, URINE, RANDOM: CREATININE, URINE: 114.04 mg/dL

## 2017-10-01 LAB — PROCALCITONIN: PROCALCITONIN: 0.14 ng/mL

## 2017-10-01 LAB — STREP PNEUMONIAE URINARY ANTIGEN: STREP PNEUMO URINARY ANTIGEN: NEGATIVE

## 2017-10-01 LAB — HAPTOGLOBIN: HAPTOGLOBIN: 246 mg/dL — AB (ref 34–200)

## 2017-10-01 LAB — MRSA PCR SCREENING: MRSA BY PCR: NEGATIVE

## 2017-10-01 MED ORDER — SODIUM CHLORIDE 0.9 % IV BOLUS (SEPSIS)
500.0000 mL | Freq: Once | INTRAVENOUS | Status: AC
  Administered 2017-10-01: 500 mL via INTRAVENOUS

## 2017-10-01 MED ORDER — SODIUM CHLORIDE 0.9 % IV SOLN
510.0000 mg | Freq: Once | INTRAVENOUS | Status: AC
  Administered 2017-10-01: 510 mg via INTRAVENOUS
  Filled 2017-10-01: qty 17

## 2017-10-01 MED ORDER — SODIUM CHLORIDE 0.9 % IV SOLN
INTRAVENOUS | Status: DC
Start: 2017-10-01 — End: 2017-10-02
  Administered 2017-10-01: 13:00:00 via INTRAVENOUS

## 2017-10-01 NOTE — Progress Notes (Addendum)
PROGRESS NOTE    Nichole Padilla  ZOX:[id number] DOB: Sep 21, 1927 DOA: 09/29/2017 PCP: Patient, No Pcp Per    Brief Narrative:  Patient is a 83 year old female history of hypertension, gout, recent hospitalization and then also symptomatic anemia from suspected upper GI source after treatment for an acute gout flare presented to the ED with 1 week of worsening exertional shortness of breath, cough, fatigue.  CT chest and chest x-ray concerning for right sided multi global pneumonia.  Patient also noted to be in A. fib which is new onset, and acute renal failure.  Patient admitted and placed empirically on IV antibiotics.  2D echo ordered.  Renal ultrasound ordered.  Lower extremity Dopplers done negative.   Assessment & Plan:   Principal Problem:   Healthcare-associated pneumonia Active Problems:   A-fib (HCC)   ARF (acute renal failure) (HCC)   HTN (hypertension)   Anemia   Gout   GERD (gastroesophageal reflux disease)   HCAP (healthcare-associated pneumonia)  1 healthcare associated pneumonia Patient admitted with exertional shortness of breath, fatigue, productive cough.  Chest x-ray CT chest negative for PE however concerning for multilobar pneumonia.  Patient has been pancultured and results pending.  But respiratory viral panel negative.  Urine Legionella antigen pending.  Urine pneumococcus antigen negative.  MRSA PCR negative.  Patient currently afebrile.  Leukocytosis trending back up.  Discontinue IV vancomycin.  Continue IV Zosyn.  Continue Mucinex and Xopenex nebulizers.  Supportive care.  If continued improvement could transition to oral Augmentin tomorrow 10/02/2017.    2.  New onset atrial fibrillation Likely secondary to problem #1.  Patient with no prior history of atrial fibrillation.CHA2DS2VASC > 3.  Cardiac enzymes minimally elevated.  2D echo with EF of 55-60%, no wall motion abnormalities, left atrium mildly dilated, moderate tricuspid valvular regurgitation.   Currently rate controlled.  Continue Lopressor for rate control.  Hold on anticoagulation as patient recently evaluated for probable upper GI bleed.  Per patient's PCP patient with a history of peptic ulcer disease as well as cirrhosis and not a good anticoagulation candidate and as such we will hold off on anticoagulation.  Will likely need outpatient follow-up with cardiology.  3.  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  Likely secondary to prerenal azotemia.  Studies obtained with a fractional excretion of sodium is 0.23%.  Spoke with patient's PCP Dr.Shah, and it was noted that patient does have a chronic kidney disease stage III/IV baseline creatinine 1.6-1.9. Renal ultrasound negative for hydronephrosis.  Urinalysis unremarkable.  ARB on hold.  Patient with worsening renal function.  Gently hydrate with IV fluids.  Follow.    4.  Gout  #5 hypertension Continue current regimen of Norvasc and Lopressor.  6.  Hyperlipidemia Continue statin.  7.  Gastroesophageal reflux disease/history of peptic ulcer disease Continue PPI.  #8 iron deficiency anemia Last hemoglobin September 16, 2017 was 7.5 per PCP.  Patient status post transfusion 1 unit packed red blood cells hemoglobin currently stable at 7.7.  Continue PPI.  Will give a dose of IV iron.  Follow H&H.    DVT prophylaxis: SCDs Code Status: Full Family Communication: Updated patient and daughter at bedside. Disposition Plan: Home once medically stable with clinical improvement with resolution of hypoxia and improvement in renal function, hopefully in 24-48 hours..   Consultants:   None  Procedures:   2D echo 09/30/2017 pending  Lower extremity  Dopplers 09/30/2017  Renal ultrasound 09/30/2017  CT angiogram chest 09/29/2017  Chest x-ray 09/29/2017  1 unit packed red blood cells  09/30/2017  Antimicrobials:  IV Zosyn 09/29/2018  IV vancomycin 09/29/2018>>>>>>> 10/01/2017   Subjective: Patient sitting on the side of the bed.  States shortness of breath improving however not at baseline.  No chest pain.  Patient states has been having good urine output.    Objective: Vitals:   09/30/17 2109 10/01/17 0521 10/01/17 0743 10/01/17 0746  BP: 131/70 135/64    Pulse: 82 69    Resp: 20 20    Temp: 98.6 F (37 C) 98.5 F (36.9 C)    TempSrc: Oral Oral    SpO2: 97% 98% 96% 96%  Weight:      Height:        Intake/Output Summary (Last 24 hours) at 10/01/2017 1225 Last data filed at 10/01/2017 1202 Gross per 24 hour  Intake 770 ml  Output -  Net 770 ml   Filed Weights   09/29/17 1248 09/30/17 0520 09/30/17 0800  Weight: 61.2 kg (135 lb) 93.9 kg (207 lb 0.2 oz) 63.1 kg (139 lb 1.8 oz)    Examination:  General exam: Appears calm and comfortable  Respiratory system: Coarse breath sounds on the right greater than left.  No crackles.  No wheezing.  Respiratory effort normal. Cardiovascular system: Regular rate and rhythm no murmurs rubs or gallops.  No JVD.  No lower extremity edema.  Gastrointestinal system: Abdomen is soft, nontender, nondistended, positive bowel sounds.  No hepatosplenomegaly.  Central nervous system: Alert and oriented. No focal neurological deficits. Extremities: Symmetric 5 x 5 power. Skin: No rashes, lesions or ulcers Psychiatry: Judgement and insight appear normal. Mood & affect appropriate.     Data Reviewed: I have personally reviewed following labs and imaging studies  CBC: Recent Labs  Lab 09/29/17 1453 09/30/17 0506 10/01/17 0614  WBC 11.1* 7.4 15.1*  HGB 7.0* 7.8* 7.7*  HCT 21.9* 23.4* 23.2*  MCV 94.4 88.3 88.5  PLT 192 175 174   Basic Metabolic Panel: Recent Labs  Lab 09/29/17 1453 09/29/17 2235 09/30/17 0506 10/01/17 0614  NA 138  --  133* 134*  K 4.3  --  4.3 4.6  CL 106  --  102 101  CO2 22  --  20* 22   GLUCOSE 135*  --  197* 122*  BUN 22*  --  21* 36*  CREATININE 1.66*  --  1.74* 2.47*  CALCIUM 9.1  --  8.6* 8.4*  MG  --  1.9  --   --   PHOS  --  3.7  --   --    GFR: Estimated Creatinine Clearance: 14.4 mL/min (A) (by C-G formula based on SCr of 2.47 mg/dL (H)). Liver Function Tests: Recent Labs  Lab 09/29/17 1740  AST 31  ALT 13*  ALKPHOS 80  BILITOT 0.5  PROT 7.2  ALBUMIN 3.1*   Recent Labs  Lab 09/29/17 1740  LIPASE 33   No results for input(s): AMMONIA in the last 168 hours. Coagulation Profile: Recent Labs  Lab 09/29/17 2235  INR 1.24   Cardiac Enzymes: Recent Labs  Lab 09/29/17 2235 09/30/17 0506  TROPONINI 0.04* 0.03*   BNP (last 3 results) No results for input(s): PROBNP in the last 8760 hours. HbA1C: Recent Labs    09/29/17 2235  HGBA1C 5.9*   CBG: No results for input(s): GLUCAP in the last 168 hours. Lipid Profile: Recent Labs  09/29/17 2235  CHOL 169  HDL 49  LDLCALC 103*  TRIG 86  CHOLHDL 3.4   Thyroid Function Tests: Recent Labs    09/29/17 2235  TSH 1.168   Anemia Panel: Recent Labs    09/29/17 1740  VITAMINB12 1,027*  FOLATE 33.0  FERRITIN 38  TIBC 402  IRON 20*  RETICCTPCT 3.2*   Sepsis Labs: Recent Labs  Lab 09/29/17 1813 09/29/17 2235 09/30/17 0506 10/01/17 0614  PROCALCITON  --  0.15 0.11 0.14  LATICACIDVEN 1.80  --   --   --     Recent Results (from the past 240 hour(s))  Blood culture (routine x 2)     Status: None (Preliminary result)   Collection Time: 09/29/17  5:58 PM  Result Value Ref Range Status   Specimen Description BLOOD RIGHT ANTECUBITAL  Final   Special Requests   Final    BOTTLES DRAWN AEROBIC AND ANAEROBIC Blood Culture adequate volume   Culture   Final    NO GROWTH 1 DAY Performed at [facility], [address], [place], [place] [address]    Report Status PENDING  Incomplete  Blood culture (routine x 2)     Status: None (Preliminary result)   Collection Time: 09/29/17   9:42 PM  Result Value Ref Range Status   Specimen Description BLOOD LEFT HAND  Final   Special Requests IN PEDIATRIC BOTTLE Blood Culture adequate volume  Final   Culture   Final    NO GROWTH 1 DAY Performed at [facility], [address] [place], [place] [address]    Report Status PENDING  Incomplete  Respiratory Panel by PCR     Status: None   Collection Time: 09/29/17  9:59 PM  Result Value Ref Range Status   Adenovirus NOT DETECTED NOT DETECTED Final   Coronavirus 229E NOT DETECTED NOT DETECTED Final   Coronavirus HKU1 NOT DETECTED NOT DETECTED Final   Coronavirus NL63 NOT DETECTED NOT DETECTED Final   Coronavirus OC43 NOT DETECTED NOT DETECTED Final   Metapneumovirus NOT DETECTED NOT DETECTED Final   Rhinovirus / Enterovirus NOT DETECTED NOT DETECTED Final   Influenza A NOT DETECTED NOT DETECTED Final   Influenza B NOT DETECTED NOT DETECTED Final   Parainfluenza Virus 1 NOT DETECTED NOT DETECTED Final   Parainfluenza Virus 2 NOT DETECTED NOT DETECTED Final   Parainfluenza Virus 3 NOT DETECTED NOT DETECTED Final   Parainfluenza Virus 4 NOT DETECTED NOT DETECTED Final   Respiratory Syncytial Virus NOT DETECTED NOT DETECTED Final   Bordetella pertussis NOT DETECTED NOT DETECTED Final   Chlamydophila pneumoniae NOT DETECTED NOT DETECTED Final   Mycoplasma pneumoniae NOT DETECTED NOT DETECTED Final    Comment: Performed at [facility], [address] [place], [place] [address]  Culture, sputum-assessment     Status: None   Collection Time: 09/30/17  9:07 AM  Result Value Ref Range Status   Specimen Description SPUTUM  Final   Special Requests Normal  Final   Sputum evaluation   Final    Sputum specimen not acceptable for testing.  Please recollect.   INFORMED FRASER,B @ 1051 ON [id number] BY POTEAT,S   Report Status 09/30/2017 FINAL  Final  MRSA PCR Screening     Status: None   Collection Time: 10/01/17  8:31 AM  Result Value Ref Range Status   MRSA by PCR  NEGATIVE NEGATIVE Final    Comment:        The  GeneXpert MRSA Assay (FDA approved for NASAL specimens only), is one component of a comprehensive MRSA colonization surveillance program. It is not intended to diagnose MRSA infection nor to guide or monitor treatment for MRSA infections.          Radiology Studies: Dg Chest 2 View  Result Date: 09/29/2017 CLINICAL DATA:  83 y/o F; history of breast cancer with mastectomy 6 years ago. Patient presents with shortness of breath today. EXAM: CHEST  2 VIEW COMPARISON:  None. FINDINGS: Borderline cardiomegaly. Calcific aortic atherosclerosis. Right mastectomy changes. Mild S-shaped curvature of the spine. Loss of left acromial humeral interval may represent rotator cuff injury. Patchy consolidations in the right upper and lower lobes. Clear left lung. Small right pleural effusion. IMPRESSION: 1. Patchy consolidations in the right upper and lower lobes and small right effusion, possibly pneumonia or asymmetric pulmonary edema. Follow-up to resolution is recommended to exclude underlying malignancy. 2. Borderline cardiomegaly.  Aortic atherosclerosis. Electronically Signed   By: Mitzi HansenLance  Furusawa-Stratton M.D.   On: 09/29/2017 17:33   Ct Angio Chest Pe W And/or Wo Contrast  Result Date: 09/29/2017 CLINICAL DATA:  83 year old female with dyspnea and weakness. EXAM: CT ANGIOGRAPHY CHEST WITH CONTRAST TECHNIQUE: Multidetector CT imaging of the chest was performed using the standard protocol during bolus administration of intravenous contrast. Multiplanar CT image reconstructions and MIPs were obtained to evaluate the vascular anatomy. CONTRAST:  75mL ISOVUE-370 IOPAMIDOL (ISOVUE-370) INJECTION 76% COMPARISON:  Chest radiograph dated 09/29/2017 FINDINGS: Cardiovascular: Top-normal cardiac size. There is no pericardial effusion. Advanced multi vessel coronary vascular calcification as well as calcification of the mitral annulus. There is moderate  atherosclerotic calcification of the thoracic aorta. No aneurysmal dilatation or evidence of dissection. Evaluation of the pulmonary artery is limited due to suboptimal opacification as well as respiratory motion artifact. Patient was initially intended to receive 75 cc of contrast however, she started to complain of burning sensation and pain at the site of the IV and contrast infusion was terminated. No central or large pulmonary artery embolus identified. Mediastinum/Nodes: There is no hilar adenopathy. Subcarinal lymph node measures 18 mm. Multiple top-normal mediastinal lymph nodes noted. Debris within the distal esophagus, likely gastroesophageal reflux or delayed esophageal clearance due to dysmotility. The thyroid gland is grossly unremarkable. Lungs/Pleura: Patchy areas of ground-glass airspace opacity noted in both lungs, right greater than left most consistent with multifocal pneumonia. Clinical correlation and follow-up to resolution recommended. There are small bilateral pleural effusions, right greater left. No pneumothorax. The central airways are patent. Upper Abdomen: There is irregular appearance of the liver contour suggestive of cirrhosis. The visualized upper abdomen is otherwise unremarkable. Musculoskeletal: There is osteopenia with degenerative changes of the spine. Old-appearing mild T5 compression deformity with central depression. T12 superior endplate compression deformity also appears old. No retropulsed fragment. No definite acute fracture. Review of the MIP images confirms the above findings. IMPRESSION: 1. Limited evaluation of the pulmonary vasculature. No definite CT evidence of central or large pulmonary artery embolus. 2. Multifocal pneumonia clinical correlation and follow-up to resolution recommended. 3. Small bilateral pleural effusions, right greater than left. 4. Coronary vascular calcification and Aortic Atherosclerosis (ICD10-I70.0). Electronically Signed   By: Elgie CollardArash   Radparvar M.D.   On: 09/29/2017 19:33   [place] Renal  Result Date: 09/30/2017 CLINICAL DATA:  Acute renal failure EXAM: RENAL / URINARY TRACT ULTRASOUND COMPLETE COMPARISON:  None. FINDINGS: Right Kidney: Length: 7.3 cm. Echogenicity within normal limits. No mass or hydronephrosis visualized. Trace perinephric fluid is noted about the  interpolar lateral aspect of the right kidney. Left Kidney: Length: 8.8 cm. Echogenicity within normal limits. No mass or hydronephrosis visualized. Bladder: Appears normal for degree of bladder distention. Other: Small right pleural effusion. IMPRESSION: 1. No obstructive uropathy, nephrolithiasis or renal mass. Maintained cortical-medullary distinction of the kidneys. 2. Small right pleural effusion. 3. Nonspecific trace perinephric fluid is identified. Electronically Signed   By: Tollie Ethavid  Kwon M.D.   On: 09/30/2017 15:01        Scheduled Meds: . amLODipine  5 mg Oral Daily  . atorvastatin  40 mg Oral q1800  . enoxaparin (LOVENOX) injection  30 mg Subcutaneous Q24H  . feeding supplement (ENSURE ENLIVE)  237 mL Oral BID BM  . guaiFENesin  1,200 mg Oral BID  . levalbuterol  0.63 mg Nebulization TID  . metoprolol tartrate  12.5 mg Oral BID  . pantoprazole  40 mg Oral BID AC   Continuous Infusions: . sodium chloride    . piperacillin-tazobactam (ZOSYN)  IV Stopped (10/01/17 0732)     LOS: 2 days    Time spent: 35 minutes    Ramiro Harvestaniel Thompson, MD [facility] Pager [phone]  If 7PM-7AM, please contact night-coverage [email or web address] Password [email or web address] 10/01/2017, 12:25 PM

## 2017-10-01 NOTE — Progress Notes (Signed)
Notified pt and pts daughter of the order for foley catheter and bladder scan. Pt asked to get up to have a BM before and daughter asked to speak with the MD before foley placement. MD aware

## 2017-10-01 NOTE — Progress Notes (Signed)
Initial Nutrition Assessment  DOCUMENTATION CODES:   Not applicable  INTERVENTION:   Ensure Enlive po BID, each supplement provides 350 kcal and 20 grams of protein  NUTRITION DIAGNOSIS:   Inadequate oral intake related to decreased appetite as evidenced by per patient/family report.  GOAL:   Patient will meet greater than or equal to 90% of their needs  MONITOR:   PO intake, Supplement acceptance, Weight trends, Labs  REASON FOR ASSESSMENT:   Malnutrition Screening Tool    ASSESSMENT:   Pt with PMH significant for HTN and gout. Recently admitted one month ago for symptomatic anemia from suspect upper GI source. Presents this admission with worsening exertional shortness of breath. Admitted for HCAP with concern for post viral pneumonia and bilateral pleural effusion.    Spoke with pt and daughter at bedside.  Reports having decreased appetite prior to admission. Admits she doesn't eat meals, just picks throughout the day. Daughter states pt eats chicken with greens for breakfast and snacks on bread or other carbohydrates the rest of the day. Pt drinks one Ensure daily. Discussed with pt to try consuming protein with every meal or when she snacks throughout the day. Pt states she has gout and cannot eat red meat, fish, or pork. Provided information of different protein options. Pt amendable to Ensure this hospital stay. Will continue ensure at home.   Weight records are limited. Pt reports a UBW of 145 lb, the last time being at that weight a few months ago. Unable to quantify weight loss time frame at this time given lack of records.   Nutrition-Focused physical exam completed.   Medications reviewed and include: NS @ 100 ml/hr, IV abx Labs reviewed: Na 134 (L) BUN 36 (H) Creatinine 2.47 (H)   NUTRITION - FOCUSED PHYSICAL EXAM:    Most Recent Value  Orbital Region  No depletion  Upper Arm Region  No depletion  Thoracic and Lumbar Region  No depletion  Buccal Region  No  depletion  Temple Region  No depletion  Clavicle Bone Region  No depletion  Clavicle and Acromion Bone Region  No depletion  Scapular Bone Region  No depletion  Dorsal Hand  No depletion  Patellar Region  No depletion  Anterior Thigh Region  No depletion  Posterior Calf Region  No depletion  Edema (RD Assessment)  Mild  Hair  Reviewed  Eyes  Reviewed  Mouth  Reviewed  Skin  Reviewed  Nails  Reviewed       Diet Order:  DIET SOFT Room service appropriate? Yes; Fluid consistency: Thin  EDUCATION NEEDS:   Education needs have been addressed  Skin:  Skin Assessment: Reviewed RN Assessment  Last BM:  PTA  Height:   Ht Readings from Last 1 Encounters:  09/29/17 5\' 3"  (1.6 m)    Weight:   Wt Readings from Last 1 Encounters:  09/30/17 139 lb 1.8 oz (63.1 kg)    Ideal Body Weight:  52.3 kg  BMI:  Body mass index is 24.64 kg/m.  Estimated Nutritional Needs:   Kcal:  1600-1800 kcal/day  Protein:  70-80 g/day  Fluid:  >1.6 L/day    Vanessa Kickarly Amey Hossain RD, LDN Clinical Nutrition Pager # - [phone]

## 2017-10-02 ENCOUNTER — Inpatient Hospital Stay (HOSPITAL_COMMUNITY): Payer: Medicare Other

## 2017-10-02 LAB — BASIC METABOLIC PANEL
ANION GAP: 11 (ref 5–15)
BUN: 38 mg/dL — AB (ref 6–20)
CHLORIDE: 102 mmol/L (ref 101–111)
CO2: 21 mmol/L — AB (ref 22–32)
Calcium: 8.5 mg/dL — ABNORMAL LOW (ref 8.9–10.3)
Creatinine, Ser: 2.42 mg/dL — ABNORMAL HIGH (ref 0.44–1.00)
GFR calc Af Amer: 20 mL/min — ABNORMAL LOW (ref >60)
GFR calc non Af Amer: 17 mL/min — ABNORMAL LOW (ref >60)
GLUCOSE: 121 mg/dL — AB (ref 65–99)
POTASSIUM: 3.9 mmol/L (ref 3.5–5.1)
Sodium: 134 mmol/L — ABNORMAL LOW (ref 135–145)

## 2017-10-02 LAB — CBC
HEMATOCRIT: 26.1 % — AB (ref 36.0–46.0)
HEMOGLOBIN: 8.7 g/dL — AB (ref 12.0–15.0)
MCH: 29.4 pg (ref 26.0–34.0)
MCHC: 33.3 g/dL (ref 30.0–36.0)
MCV: 88.2 fL (ref 78.0–100.0)
Platelets: 191 10*3/uL (ref 150–400)
RBC: 2.96 MIL/uL — ABNORMAL LOW (ref 3.87–5.11)
RDW: 18.2 % — AB (ref 11.5–15.5)
WBC: 14 10*3/uL — ABNORMAL HIGH (ref 4.0–10.5)

## 2017-10-02 LAB — LEGIONELLA PNEUMOPHILA SEROGP 1 UR AG: L. pneumophila Serogp 1 Ur Ag: NEGATIVE

## 2017-10-02 MED ORDER — AMOXICILLIN-POT CLAVULANATE 875-125 MG PO TABS: 1.0000 | ORAL_TABLET | Freq: Two times a day (BID) | ORAL | Status: DC

## 2017-10-02 MED ORDER — LEVALBUTEROL HCL 0.63 MG/3ML IN NEBU
INHALATION_SOLUTION | RESPIRATORY_TRACT | Status: AC
  Administered 2017-10-02: 0.63 mg
  Filled 2017-10-02: qty 3

## 2017-10-02 MED ORDER — AMOXICILLIN-POT CLAVULANATE 875-125 MG PO TABS: 1.0000 | ORAL_TABLET | Freq: Two times a day (BID) | ORAL | 0 refills | Status: AC

## 2017-10-02 MED ORDER — AMLODIPINE BESYLATE 10 MG PO TABS: 10.0000 mg | ORAL_TABLET | Freq: Every day | ORAL | 0 refills | Status: AC

## 2017-10-02 MED ORDER — LEVALBUTEROL HCL 0.63 MG/3ML IN NEBU: 0.6300 mg | INHALATION_SOLUTION | Freq: Four times a day (QID) | RESPIRATORY_TRACT | Status: DC | PRN

## 2017-10-02 MED ORDER — AMLODIPINE BESYLATE 10 MG PO TABS
10.0000 mg | ORAL_TABLET | Freq: Every day | ORAL | Status: DC
  Administered 2017-10-02: 10 mg via ORAL
  Filled 2017-10-02: qty 1

## 2017-10-02 NOTE — Progress Notes (Signed)
Date: October 02, 2017 Chart review for discharge needs:  None found for case management. Patient has no questions concerning post hospital care. 

## 2017-10-02 NOTE — Discharge Summary (Signed)
Physician Discharge Summary  Nita SellsConstance Trew XLK:[id number] DOB: 1926-12-17 DOA: 09/29/2017  PCP: Patient, No Pcp Per  Admit date: 09/29/2017 Discharge date: 10/02/2017  Time spent: 35 minutes  Recommendations for Outpatient Follow-up:  PCP in 1 week, please check Bmet and CBC at FU, please start oral iron at follow-up FU CXR in 4-6weeks  Discharge Diagnoses:  Principal Problem:   Healthcare-associated pneumonia   Iron deficiency anemia   A-fib (HCC)   ARF (acute renal failure) (HCC)   HTN (hypertension)   Anemia   Gout   GERD (gastroesophageal reflux disease)   HCAP (healthcare-associated pneumonia)   Renal failure (ARF), 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   Cirrhosis (HCC)   Discharge Condition: stable  Diet recommendation: low sodium  Filed Weights   09/30/17 0520 09/30/17 0800 10/01/17 1314  Weight: 93.9 kg (207 lb 0.2 oz) 63.1 kg (139 lb 1.8 oz) 66.4 kg (146 lb 6.4 oz)    History of present illness:  [age] year old female with gout, recent hospitalization for symptomatic anemia from upper GI source, admitted with exertional shortness of breath cough and fatigue, chest x-ray on admission notable for multilobar pneumonia, also noted to have acute renal failure  Hospital Course:   1.Healthcare associated pneumonia Patient admitted with exertional shortness of breath, fatigue, productive cough.   -CT chest noted multilobar pneumonia.   -Blood and sputum cultures negative, respiratory virus panel negative  -Treated with broad-spectrum IV anabiotic since subsequently transitioned to oral Augmentin today  -She is clinically improved, weaned off oxygen, will need follow-up chest x-ray in 4-6 weeks  2.  New onset atrial fibrillation -Likely secondary to above pneumonia, on admission she was noted to have atrial fibrillation which self converted to sinus rhythm. She had a 2-D echocardiogram which showed normal ejection fraction, no wall motion abnormalities and mildly  dilated left atrium -Heart rate in the 60-70 range without any rate control agents and in sinus rhythm at discharge -We considered anticoagulation however due to recent hospitalization with upper GI bleed and history of cirrhosis, my partner Dr. Janee Mornhompson discussed with her primary care doctor and she was not felt to be a good anticoagulation candidate, I agree with this. -Recommend a follow-up with cardiology as outpatient and readdress anticoagulation if she does not have further bleeding episodes.  3. 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  Gout -Stable   5 hypertension Continue current regimen of Norvasc and Lopressor. - ARB discontinued , Norvasc dose increased to 10 mg   6.  Hyperlipidemia Continue statin.  7.  Gastroesophageal reflux disease/history of peptic ulcer disease Continue PPI.  #8 iron deficiency anemia Last hemoglobin September 16, 2017 was 7.5 per PCP.   -She was given 1 unit of PRBC this admission  -Hemoglobin stable and improved to 8.7  -Recommended to have CBC checked in 1 week and start oral iron at the time of follow-up    Discharge Exam: Vitals:   10/02/17 0732 10/02/17 0736  BP:    Pulse:    Resp:    Temp:    SpO2: 94% 94%  General: AAOx3 Cardiovascular: S1S2/RRR Respiratory: CTAB  Discharge Instructions   Discharge Instructions    Diet - low sodium heart healthy   Complete by:  As directed    Increase activity slowly   Complete by:  As directed      Discharge Medication List as of 10/02/2017 11:53  AM    START taking these medications   Details  amoxicillin-clavulanate (AUGMENTIN) 875-125 MG tablet Take 1 tablet by mouth every 12 (twelve) hours. For 4days, Starting Wed 10/02/2017, Print      CONTINUE these medications which have CHANGED   Details  amLODipine (NORVASC) 10 MG tablet Take 1 tablet (10 mg total) by mouth daily., Starting Wed 10/02/2017, Print      CONTINUE these medications which have NOT CHANGED   Details  Apoaequorin (PREVAGEN EXTRA STRENGTH) 20 MG CAPS Take 20 mg daily by mouth., Historical Med    Cholecalciferol (VITAMIN D3) 2000 units TABS Take 1 tablet daily by mouth., Historical Med    furosemide (LASIX) 20 MG tablet Take 20 mg daily as needed by mouth for fluid or edema., Historical Med    Multiple Vitamin (MULTIVITAMIN WITH MINERALS) TABS tablet Take 1 tablet daily by mouth., Historical Med    omeprazole (PRILOSEC) 20 MG capsule Take 20 mg daily by mouth., Historical Med    traMADol-acetaminophen (ULTRACET) 37.5-325 MG tablet Take 1 tablet 2 (two) times daily as needed by mouth for moderate pain., Historical Med      STOP taking these medications     losartan (COZAAR) 100 MG tablet        No Known Allergies Follow-up Information    PCP. Schedule an appointment as soon as possible for a visit in 1 week(s).   Why:  Please check Kidney function next week at Follow up           The results of significant diagnostics from this hospitalization (including imaging, microbiology, ancillary and laboratory) are listed below for reference.    Significant Diagnostic Studies: Dg Chest 2 View  Result Date: 10/02/2017 CLINICAL DATA:  Shortness of breath and chest pain EXAM: CHEST  2 VIEW COMPARISON:  09/29/2017 FINDINGS: Cardiac shadow is stable. The lungs are well aerated with persistent infiltrates bilaterally right greater than left. The overall appearance is stable from the prior exam. No new sizable effusion is seen. No bony abnormality is noted.  IMPRESSION: Stable bilateral infiltrates. Electronically Signed   By: Alcide CleverMark  Lukens M.D.   On: 10/02/2017 10:48   Dg Chest 2 View  Result Date: 09/29/2017 CLINICAL DATA:  83 y/o F; history of breast cancer with mastectomy 6 years ago. Patient presents with shortness of breath today. EXAM: CHEST  2 VIEW COMPARISON:  None. FINDINGS: Borderline cardiomegaly. Calcific aortic atherosclerosis. Right mastectomy changes. Mild S-shaped curvature of the spine. Loss of left acromial humeral interval may represent rotator cuff injury. Patchy consolidations in the right upper and lower lobes. Clear left lung. Small right pleural effusion. IMPRESSION: 1. Patchy consolidations in the right upper and lower lobes and small right effusion, possibly pneumonia or asymmetric pulmonary edema. Follow-up to resolution is recommended to exclude underlying malignancy. 2. Borderline cardiomegaly.  Aortic atherosclerosis. Electronically Signed   By: Mitzi HansenLance  Furusawa-Stratton M.D.   On: 09/29/2017 17:33   Ct Angio Chest Pe W And/or Wo Contrast  Result Date: 09/29/2017 CLINICAL DATA:  83 year old female with dyspnea and weakness. EXAM: CT ANGIOGRAPHY CHEST WITH CONTRAST TECHNIQUE: Multidetector CT imaging of the chest was performed using the standard protocol during  bolus administration of intravenous contrast. Multiplanar CT image reconstructions and MIPs were obtained to evaluate the vascular anatomy. CONTRAST:  75mL ISOVUE-370 IOPAMIDOL (ISOVUE-370) INJECTION 76% COMPARISON:  Chest radiograph dated 09/29/2017 FINDINGS: Cardiovascular: Top-normal cardiac size. There is no pericardial effusion. Advanced multi vessel coronary vascular calcification as well as calcification of the mitral annulus. There is moderate atherosclerotic calcification of the thoracic aorta. No aneurysmal dilatation or evidence of dissection. Evaluation of the pulmonary artery is limited due to suboptimal opacification as well as respiratory motion artifact.  Patient was initially intended to receive 75 cc of contrast however, she started to complain of burning sensation and pain at the site of the IV and contrast infusion was terminated. No central or large pulmonary artery embolus identified. Mediastinum/Nodes: There is no hilar adenopathy. Subcarinal lymph node measures 18 mm. Multiple top-normal mediastinal lymph nodes noted. Debris within the distal esophagus, likely gastroesophageal reflux or delayed esophageal clearance due to dysmotility. The thyroid gland is grossly unremarkable. Lungs/Pleura: Patchy areas of ground-glass airspace opacity noted in both lungs, right greater than left most consistent with multifocal pneumonia. Clinical correlation and follow-up to resolution recommended. There are small bilateral pleural effusions, right greater left. No pneumothorax. The central airways are patent. Upper Abdomen: There is irregular appearance of the liver contour suggestive of cirrhosis. The visualized upper abdomen is otherwise unremarkable. Musculoskeletal: There is osteopenia with degenerative changes of the spine. Old-appearing mild T5 compression deformity with central depression. T12 superior endplate compression deformity also appears old. No retropulsed fragment. No definite acute fracture. Review of the MIP images confirms the above findings. IMPRESSION: 1. Limited evaluation of the pulmonary vasculature. No definite CT evidence of central or large pulmonary artery embolus. 2. Multifocal pneumonia clinical correlation and follow-up to resolution recommended. 3. Small bilateral pleural effusions, right greater than left. 4. Coronary vascular calcification and Aortic Atherosclerosis (ICD10-I70.0). Electronically Signed   By: Elgie Collard M.D.   On: 09/29/2017 19:33   US Renal  Result Date: 09/30/2017 CLINICAL DATA:  Acute renal failure EXAM: RENAL / URINARY TRACT ULTRASOUND COMPLETE COMPARISON:  None. FINDINGS: Right Kidney: Length: 7.3 cm.  Echogenicity within normal limits. No mass or hydronephrosis visualized. Trace perinephric fluid is noted about the interpolar lateral aspect of the right kidney. Left Kidney: Length: 8.8 cm. Echogenicity within normal limits. No mass or hydronephrosis visualized. Bladder: Appears normal for degree of bladder distention. Other: Small right pleural effusion. IMPRESSION: 1. No obstructive uropathy, nephrolithiasis or renal mass. Maintained cortical-medullary distinction of the kidneys. 2. Small right pleural effusion. 3. Nonspecific trace perinephric fluid is identified. Electronically Signed   By: Tollie Eth M.D.   On: 09/30/2017 15:01    Microbiology: Recent Results (from the past 240 hour(s))  Blood culture (routine x 2)     Status: None (Preliminary result)   Collection Time: 09/29/17  5:58 PM  Result Value Ref Range Status   Specimen Description BLOOD RIGHT ANTECUBITAL  Final   Special Requests   Final    BOTTLES DRAWN AEROBIC AND ANAEROBIC Blood Culture adequate volume   Culture   Final    NO GROWTH 1 DAY Performed at [facility], [address] [place], [place] [address]    Report Status PENDING  Incomplete  Blood culture (routine x 2)     Status: None (Preliminary result)   Collection Time: 09/29/17  9:42 PM  Result Value Ref Range Status   Specimen Description BLOOD LEFT HAND  Final   Special Requests IN PEDIATRIC BOTTLE  Blood Culture adequate volume  Final   Culture   Final    NO GROWTH 1 DAY Performed at [facility], [address], [place], [place] [address]    Report Status PENDING  Incomplete  Respiratory Panel by PCR     Status: None   Collection Time: 09/29/17  9:59 PM  Result Value Ref Range Status   Adenovirus NOT DETECTED NOT DETECTED Final   Coronavirus 229E NOT DETECTED NOT DETECTED Final   Coronavirus HKU1 NOT DETECTED NOT DETECTED Final   Coronavirus NL63 NOT DETECTED NOT DETECTED Final   Coronavirus OC43 NOT DETECTED NOT DETECTED Final    Metapneumovirus NOT DETECTED NOT DETECTED Final   Rhinovirus / Enterovirus NOT DETECTED NOT DETECTED Final   Influenza A NOT DETECTED NOT DETECTED Final   Influenza B NOT DETECTED NOT DETECTED Final   Parainfluenza Virus 1 NOT DETECTED NOT DETECTED Final   Parainfluenza Virus 2 NOT DETECTED NOT DETECTED Final   Parainfluenza Virus 3 NOT DETECTED NOT DETECTED Final   Parainfluenza Virus 4 NOT DETECTED NOT DETECTED Final   Respiratory Syncytial Virus NOT DETECTED NOT DETECTED Final   Bordetella pertussis NOT DETECTED NOT DETECTED Final   Chlamydophila pneumoniae NOT DETECTED NOT DETECTED Final   Mycoplasma pneumoniae NOT DETECTED NOT DETECTED Final    Comment: Performed at [facility], [address], [place], [place] [address]  Culture, sputum-assessment     Status: None   Collection Time: 09/30/17  9:07 AM  Result Value Ref Range Status   Specimen Description SPUTUM  Final   Special Requests Normal  Final   Sputum evaluation   Final    Sputum specimen not acceptable for testing.  Please recollect.   INFORMED FRASER,B @ 1051 ON [id number] BY POTEAT,S   Report Status 09/30/2017 FINAL  Final  MRSA PCR Screening     Status: None   Collection Time: 10/01/17  8:31 AM  Result Value Ref Range Status   MRSA by PCR NEGATIVE NEGATIVE Final    Comment:        The GeneXpert MRSA Assay (FDA approved for NASAL specimens only), is one component of a comprehensive MRSA colonization surveillance program. It is not intended to diagnose MRSA infection nor to guide or monitor treatment for MRSA infections.      Labs: Basic Metabolic Panel: Recent Labs  Lab 09/29/17 1453 09/29/17 2235 09/30/17 0506 10/01/17 0614 10/01/17 1544 10/02/17 0608  NA 138  --  133* 134* 135 134*  K 4.3  --  4.3 4.6 4.2 3.9  CL 106  --  102 101 102 102  CO2 22  --  20* 22 22 21*  GLUCOSE 135*  --  197* 122* 173* 121*  BUN 22*  --  21* 36* 40* 38*  CREATININE 1.66*  --  1.74* 2.47* 2.64* 2.42*  CALCIUM 9.1   --  8.6* 8.4* 8.4* 8.5*  MG  --  1.9  --   --   --   --   PHOS  --  3.7  --   --   --   --    Liver Function Tests: Recent Labs  Lab 09/29/17 1740  AST 31  ALT 13*  ALKPHOS 80  BILITOT 0.5  PROT 7.2  ALBUMIN 3.1*   Recent Labs  Lab 09/29/17 1740  LIPASE 33   No results for input(s): AMMONIA in the last 168 hours. CBC: Recent Labs  Lab 09/29/17 1453 09/30/17 0506 10/01/17 0981 10/02/17  1610  WBC 11.1* 7.4 15.1* 14.0*  HGB 7.0* 7.8* 7.7* 8.7*  HCT 21.9* 23.4* 23.2* 26.1*  MCV 94.4 88.3 88.5 88.2  PLT 192 175 174 191   Cardiac Enzymes: Recent Labs  Lab 09/29/17 2235 09/30/17 0506  TROPONINI 0.04* 0.03*   BNP: BNP (last 3 results) Recent Labs    09/29/17 1830  BNP 1,096.3*    ProBNP (last 3 results) No results for input(s): PROBNP in the last 8760 hours.  CBG: No results for input(s): GLUCAP in the last 168 hours.     Signed:  Zannie Cove MD.  [facility] 10/02/2017, 2:29 PM

## 2017-10-02 NOTE — Progress Notes (Signed)
Pt transported to the main entrance via wheelchair with nursing staff and family present.

## 2017-10-03 LAB — TYPE AND SCREEN
ABO/RH(D): O POS
Antibody Screen: NEGATIVE
UNIT DIVISION: 0
Unit division: 0

## 2017-10-03 LAB — BPAM RBC
BLOOD PRODUCT EXPIRATION DATE: 201812052359
Blood Product Expiration Date: 201812052359
ISSUE DATE / TIME: 201811190001
UNIT TYPE AND RH: 5100
Unit Type and Rh: 5100

## 2017-10-05 ENCOUNTER — Inpatient Hospital Stay (HOSPITAL_COMMUNITY)
Admission: EM | Admit: 2017-10-05 | Discharge: 2017-11-12 | DRG: 291 | Disposition: E | Payer: Medicare Other | Attending: Internal Medicine | Admitting: Internal Medicine

## 2017-10-05 ENCOUNTER — Inpatient Hospital Stay (HOSPITAL_COMMUNITY): Payer: Medicare Other

## 2017-10-05 ENCOUNTER — Emergency Department (HOSPITAL_COMMUNITY): Payer: Medicare Other

## 2017-10-05 ENCOUNTER — Encounter (HOSPITAL_COMMUNITY): Payer: Self-pay

## 2017-10-05 DIAGNOSIS — I481 Persistent atrial fibrillation: Secondary | ICD-10-CM | POA: Diagnosis present

## 2017-10-05 DIAGNOSIS — R0902 Hypoxemia: Secondary | ICD-10-CM

## 2017-10-05 DIAGNOSIS — R609 Edema, unspecified: Secondary | ICD-10-CM | POA: Diagnosis not present

## 2017-10-05 DIAGNOSIS — Z8711 Personal history of peptic ulcer disease: Secondary | ICD-10-CM | POA: Diagnosis not present

## 2017-10-05 DIAGNOSIS — N179 Acute kidney failure, unspecified: Secondary | ICD-10-CM | POA: Diagnosis not present

## 2017-10-05 DIAGNOSIS — G9341 Metabolic encephalopathy: Secondary | ICD-10-CM | POA: Diagnosis present

## 2017-10-05 DIAGNOSIS — E785 Hyperlipidemia, unspecified: Secondary | ICD-10-CM | POA: Diagnosis present

## 2017-10-05 DIAGNOSIS — I480 Paroxysmal atrial fibrillation: Secondary | ICD-10-CM | POA: Diagnosis present

## 2017-10-05 DIAGNOSIS — Z01818 Encounter for other preprocedural examination: Secondary | ICD-10-CM

## 2017-10-05 DIAGNOSIS — Z66 Do not resuscitate: Secondary | ICD-10-CM | POA: Diagnosis not present

## 2017-10-05 DIAGNOSIS — M109 Gout, unspecified: Secondary | ICD-10-CM | POA: Diagnosis present

## 2017-10-05 DIAGNOSIS — I509 Heart failure, unspecified: Secondary | ICD-10-CM | POA: Diagnosis not present

## 2017-10-05 DIAGNOSIS — R739 Hyperglycemia, unspecified: Secondary | ICD-10-CM | POA: Diagnosis not present

## 2017-10-05 DIAGNOSIS — N170 Acute kidney failure with tubular necrosis: Secondary | ICD-10-CM | POA: Diagnosis present

## 2017-10-05 DIAGNOSIS — Z5309 Procedure and treatment not carried out because of other contraindication: Secondary | ICD-10-CM

## 2017-10-05 DIAGNOSIS — D6489 Other specified anemias: Secondary | ICD-10-CM | POA: Diagnosis present

## 2017-10-05 DIAGNOSIS — J189 Pneumonia, unspecified organism: Secondary | ICD-10-CM

## 2017-10-05 DIAGNOSIS — D649 Anemia, unspecified: Secondary | ICD-10-CM

## 2017-10-05 DIAGNOSIS — J9600 Acute respiratory failure, unspecified whether with hypoxia or hypercapnia: Secondary | ICD-10-CM

## 2017-10-05 DIAGNOSIS — I4891 Unspecified atrial fibrillation: Secondary | ICD-10-CM | POA: Diagnosis not present

## 2017-10-05 DIAGNOSIS — Z515 Encounter for palliative care: Secondary | ICD-10-CM | POA: Diagnosis not present

## 2017-10-05 DIAGNOSIS — I5031 Acute diastolic (congestive) heart failure: Secondary | ICD-10-CM | POA: Diagnosis present

## 2017-10-05 DIAGNOSIS — E872 Acidosis: Secondary | ICD-10-CM | POA: Diagnosis present

## 2017-10-05 DIAGNOSIS — Z452 Encounter for adjustment and management of vascular access device: Secondary | ICD-10-CM

## 2017-10-05 DIAGNOSIS — K219 Gastro-esophageal reflux disease without esophagitis: Secondary | ICD-10-CM | POA: Diagnosis present

## 2017-10-05 DIAGNOSIS — Y95 Nosocomial condition: Secondary | ICD-10-CM | POA: Diagnosis present

## 2017-10-05 DIAGNOSIS — N185 Chronic kidney disease, stage 5: Secondary | ICD-10-CM | POA: Diagnosis present

## 2017-10-05 DIAGNOSIS — I083 Combined rheumatic disorders of mitral, aortic and tricuspid valves: Secondary | ICD-10-CM | POA: Diagnosis present

## 2017-10-05 DIAGNOSIS — J9690 Respiratory failure, unspecified, unspecified whether with hypoxia or hypercapnia: Secondary | ICD-10-CM

## 2017-10-05 DIAGNOSIS — N171 Acute kidney failure with acute cortical necrosis: Secondary | ICD-10-CM | POA: Diagnosis not present

## 2017-10-05 DIAGNOSIS — E876 Hypokalemia: Secondary | ICD-10-CM | POA: Diagnosis present

## 2017-10-05 DIAGNOSIS — K7460 Unspecified cirrhosis of liver: Secondary | ICD-10-CM | POA: Diagnosis present

## 2017-10-05 DIAGNOSIS — I4892 Unspecified atrial flutter: Secondary | ICD-10-CM

## 2017-10-05 DIAGNOSIS — I5033 Acute on chronic diastolic (congestive) heart failure: Secondary | ICD-10-CM | POA: Diagnosis not present

## 2017-10-05 DIAGNOSIS — J9601 Acute respiratory failure with hypoxia: Secondary | ICD-10-CM | POA: Diagnosis present

## 2017-10-05 DIAGNOSIS — I2722 Pulmonary hypertension due to left heart disease: Secondary | ICD-10-CM | POA: Diagnosis present

## 2017-10-05 DIAGNOSIS — N184 Chronic kidney disease, stage 4 (severe): Secondary | ICD-10-CM | POA: Diagnosis not present

## 2017-10-05 DIAGNOSIS — J155 Pneumonia due to Escherichia coli: Secondary | ICD-10-CM | POA: Diagnosis present

## 2017-10-05 DIAGNOSIS — N289 Disorder of kidney and ureter, unspecified: Secondary | ICD-10-CM

## 2017-10-05 DIAGNOSIS — R7989 Other specified abnormal findings of blood chemistry: Secondary | ICD-10-CM

## 2017-10-05 DIAGNOSIS — J9602 Acute respiratory failure with hypercapnia: Secondary | ICD-10-CM

## 2017-10-05 DIAGNOSIS — I132 Hypertensive heart and chronic kidney disease with heart failure and with stage 5 chronic kidney disease, or end stage renal disease: Secondary | ICD-10-CM | POA: Diagnosis present

## 2017-10-05 DIAGNOSIS — Z0189 Encounter for other specified special examinations: Secondary | ICD-10-CM

## 2017-10-05 DIAGNOSIS — N189 Chronic kidney disease, unspecified: Secondary | ICD-10-CM | POA: Diagnosis not present

## 2017-10-05 DIAGNOSIS — J811 Chronic pulmonary edema: Secondary | ICD-10-CM

## 2017-10-05 HISTORY — DX: Unspecified atrial fibrillation: I48.91

## 2017-10-05 LAB — BASIC METABOLIC PANEL
ANION GAP: 14 (ref 5–15)
ANION GAP: 10 (ref 5–15)
BUN: 27 mg/dL — ABNORMAL HIGH (ref 6–20)
BUN: 31 mg/dL — ABNORMAL HIGH (ref 6–20)
CALCIUM: 9.5 mg/dL (ref 8.9–10.3)
CHLORIDE: 102 mmol/L (ref 101–111)
CHLORIDE: 103 mmol/L (ref 101–111)
CO2: 18 mmol/L — AB (ref 22–32)
CO2: 21 mmol/L — ABNORMAL LOW (ref 22–32)
CREATININE: 2.19 mg/dL — AB (ref 0.44–1.00)
Calcium: 8.9 mg/dL (ref 8.9–10.3)
Creatinine, Ser: 2.12 mg/dL — ABNORMAL HIGH (ref 0.44–1.00)
GFR calc non Af Amer: 20 mL/min — ABNORMAL LOW (ref >60)
GFR calc non Af Amer: 19 mL/min — ABNORMAL LOW (ref >60)
GFR, EST AFRICAN AMERICAN: 23 mL/min — AB (ref >60)
GFR, EST AFRICAN AMERICAN: 22 mL/min — AB (ref >60)
GLUCOSE: 234 mg/dL — AB (ref 65–99)
Glucose, Bld: 183 mg/dL — ABNORMAL HIGH (ref 65–99)
POTASSIUM: 4 mmol/L (ref 3.5–5.1)
Potassium: 3.9 mmol/L (ref 3.5–5.1)
SODIUM: 134 mmol/L — AB (ref 135–145)
Sodium: 134 mmol/L — ABNORMAL LOW (ref 135–145)

## 2017-10-05 LAB — BODY FLUID CELL COUNT WITH DIFFERENTIAL
EOS FL: 2 %
LYMPHS FL: 18 %
MONOCYTE-MACROPHAGE-SEROUS FLUID: 8 % — AB (ref 50–90)
NEUTROPHIL FLUID: 72 % — AB (ref 0–25)
Total Nucleated Cell Count, Fluid: 128 cu mm (ref 0–1000)

## 2017-10-05 LAB — CULTURE, BLOOD (ROUTINE X 2)
CULTURE: NO GROWTH
Culture: NO GROWTH
Special Requests: ADEQUATE
Special Requests: ADEQUATE

## 2017-10-05 LAB — DIFFERENTIAL
BASOS ABS: 0 10*3/uL (ref 0.0–0.1)
BASOS PCT: 0 %
Eosinophils Absolute: 0 10*3/uL (ref 0.0–0.7)
Eosinophils Relative: 0 %
Lymphocytes Relative: 6 %
Lymphs Abs: 1.2 10*3/uL (ref 0.7–4.0)
MONO ABS: 1.8 10*3/uL — AB (ref 0.1–1.0)
Monocytes Relative: 9 %
NEUTROS ABS: 18 10*3/uL — AB (ref 1.7–7.7)
NEUTROS PCT: 85 %

## 2017-10-05 LAB — BLOOD GAS, ARTERIAL
ACID-BASE DEFICIT: 6.5 mmol/L — AB (ref 0.0–2.0)
Acid-base deficit: 6.1 mmol/L — ABNORMAL HIGH (ref 0.0–2.0)
BICARBONATE: 17 mmol/L — AB (ref 20.0–28.0)
Bicarbonate: 20 mmol/L (ref 20.0–28.0)
DRAWN BY: 270211
Drawn by: 295031
FIO2: 1
O2 CONTENT: 6 L/min
O2 SAT: 99.3 %
O2 Saturation: 85.3 %
PATIENT TEMPERATURE: 37
PATIENT TEMPERATURE: 98.6
PCO2 ART: 47.3 mmHg (ref 32.0–48.0)
PEEP: 5 cmH2O
PO2 ART: 55.5 mmHg — AB (ref 83.0–108.0)
PO2 ART: 195 mmHg — AB (ref 83.0–108.0)
RATE: 20 resp/min
VT: 400 mL
pCO2 arterial: 27.8 mmHg — ABNORMAL LOW (ref 32.0–48.0)
pH, Arterial: 7.404 (ref 7.350–7.450)
pH, Arterial: 7.25 — ABNORMAL LOW (ref 7.350–7.450)

## 2017-10-05 LAB — ECHOCARDIOGRAM COMPLETE
AO mean calculated velocity dopler: 171 cm/s
AOVTI: 59.5 cm
AV Area VTI: 0.95 cm2
AV Area mean vel: 0.94 cm2
AV Peak grad: 24 mmHg
AV VEL mean LVOT/AV: 0.33
AV pk vel: 246 cm/s
AV vel: 0.85
AVA: 0.85 cm2
AVAREAMEANVIN: 0.57 cm2/m2
AVAREAVTIIND: 0.51 cm2/m2
AVG: 14 mmHg
Ao pk vel: 0.33 m/s
CHL CUP AV PEAK INDEX: 0.57
CHL CUP RV SYS PRESS: 71 mmHg
CHL CUP TV REG PEAK VELOCITY: 375 cm/s
E decel time: 190 msec
EERAT: 13.17
FS: 29 % (ref 28–44)
Height: 63 in
IV/PV OW: 1.09
LA ID, A-P, ES: 43 mm
LA diam end sys: 43 mm
LA vol A4C: 52.1 ml
LA vol index: 45 mL/m2
LA vol: 74.7 mL
LADIAMINDEX: 2.59 cm/m2
LV PW d: 8.66 mm — AB (ref 0.6–1.1)
LV TDI E'LATERAL: 7.29
LV TDI E'MEDIAL: 9.14
LV e' LATERAL: 7.29 cm/s
LVEEAVG: 13.17
LVEEMED: 13.17
LVOT SV: 51 mL
LVOT VTI: 17.9 cm
LVOT area: 2.84 cm2
LVOT diameter: 19 mm
LVOT peak VTI: 0.3 cm
LVOT peak vel: 82 cm/s
MV Dec: 190
MV pk A vel: 96 m/s
MV pk E vel: 96 m/s
MVPG: 4 mmHg
RV LATERAL S' VELOCITY: 13.6 cm/s
TAPSE: 23 mm
TR max vel: 375 cm/s
Valve area index: 0.51
Weight: 2225.76 oz

## 2017-10-05 LAB — SEDIMENTATION RATE: Sed Rate: 105 mm/hr — ABNORMAL HIGH (ref 0–22)

## 2017-10-05 LAB — TROPONIN I: Troponin I: 0.06 ng/mL (ref <0.03)

## 2017-10-05 LAB — GLUCOSE, CAPILLARY
GLUCOSE-CAPILLARY: 166 mg/dL — AB (ref 65–99)
GLUCOSE-CAPILLARY: 153 mg/dL — AB (ref 65–99)
Glucose-Capillary: 177 mg/dL — ABNORMAL HIGH (ref 65–99)

## 2017-10-05 LAB — PROCALCITONIN: Procalcitonin: 0.5 ng/mL

## 2017-10-05 LAB — CBC
HEMATOCRIT: 26.9 % — AB (ref 36.0–46.0)
HEMOGLOBIN: 8.8 g/dL — AB (ref 12.0–15.0)
MCH: 29 pg (ref 26.0–34.0)
MCHC: 32.7 g/dL (ref 30.0–36.0)
MCV: 88.8 fL (ref 78.0–100.0)
Platelets: 172 10*3/uL (ref 150–400)
RBC: 3.03 MIL/uL — AB (ref 3.87–5.11)
RDW: 17.7 % — ABNORMAL HIGH (ref 11.5–15.5)
WBC: 22.4 10*3/uL — ABNORMAL HIGH (ref 4.0–10.5)

## 2017-10-05 LAB — TRIGLYCERIDES: Triglycerides: 84 mg/dL (ref <150)

## 2017-10-05 LAB — BRAIN NATRIURETIC PEPTIDE: B NATRIURETIC PEPTIDE 5: 1248 pg/mL — AB (ref 0.0–100.0)

## 2017-10-05 LAB — LACTIC ACID, PLASMA: LACTIC ACID, VENOUS: 1.3 mmol/L (ref 0.5–1.9)

## 2017-10-05 LAB — PHOSPHORUS: Phosphorus: 4.8 mg/dL — ABNORMAL HIGH (ref 2.5–4.6)

## 2017-10-05 LAB — MAGNESIUM
MAGNESIUM: 2.1 mg/dL (ref 1.7–2.4)
MAGNESIUM: 2.1 mg/dL (ref 1.7–2.4)

## 2017-10-05 LAB — MRSA PCR SCREENING: MRSA by PCR: NEGATIVE

## 2017-10-05 LAB — C-REACTIVE PROTEIN: CRP: 13.5 mg/dL — AB (ref <1.0)

## 2017-10-05 LAB — I-STAT CG4 LACTIC ACID, ED: LACTIC ACID, VENOUS: 3.56 mmol/L — AB (ref 0.5–1.9)

## 2017-10-05 LAB — I-STAT TROPONIN, ED: Troponin i, poc: 0.03 ng/mL (ref 0.00–0.08)

## 2017-10-05 MED ORDER — PANTOPRAZOLE SODIUM 40 MG IV SOLR
40.0000 mg | INTRAVENOUS | Status: DC
  Administered 2017-10-05 – 2017-10-09 (×5): 40 mg via INTRAVENOUS
  Filled 2017-10-05 (×5): qty 40

## 2017-10-05 MED ORDER — ACETAMINOPHEN 650 MG RE SUPP: 650.0000 mg | Freq: Four times a day (QID) | RECTAL | Status: DC | PRN

## 2017-10-05 MED ORDER — INSULIN ASPART 100 UNIT/ML ~~LOC~~ SOLN
0.0000 [IU] | Freq: Every day | SUBCUTANEOUS | Status: DC
  Administered 2017-10-08: 2 [IU] via SUBCUTANEOUS

## 2017-10-05 MED ORDER — FUROSEMIDE 10 MG/ML IJ SOLN: 80.0000 mg | Freq: Two times a day (BID) | INTRAMUSCULAR | Status: DC

## 2017-10-05 MED ORDER — INSULIN ASPART 100 UNIT/ML ~~LOC~~ SOLN
0.0000 [IU] | Freq: Three times a day (TID) | SUBCUTANEOUS | Status: DC
  Administered 2017-10-05: 2 [IU] via SUBCUTANEOUS
  Administered 2017-10-06 (×2): 1 [IU] via SUBCUTANEOUS
  Administered 2017-10-06 – 2017-10-07 (×4): 2 [IU] via SUBCUTANEOUS
  Administered 2017-10-08: 3 [IU] via SUBCUTANEOUS
  Administered 2017-10-08: 1 [IU] via SUBCUTANEOUS
  Administered 2017-10-08 – 2017-10-09 (×2): 2 [IU] via SUBCUTANEOUS
  Administered 2017-10-09: 3 [IU] via SUBCUTANEOUS
  Administered 2017-10-09: 2 [IU] via SUBCUTANEOUS

## 2017-10-05 MED ORDER — ONDANSETRON HCL 4 MG/2ML IJ SOLN: 4.0000 mg | Freq: Four times a day (QID) | INTRAMUSCULAR | Status: DC | PRN

## 2017-10-05 MED ORDER — ONDANSETRON HCL 4 MG PO TABS: 4.0000 mg | ORAL_TABLET | Freq: Four times a day (QID) | ORAL | Status: DC | PRN

## 2017-10-05 MED ORDER — FUROSEMIDE 10 MG/ML IJ SOLN
40.0000 mg | Freq: Once | INTRAMUSCULAR | Status: AC
  Administered 2017-10-05: 40 mg via INTRAVENOUS
  Filled 2017-10-05: qty 4

## 2017-10-05 MED ORDER — ALBUTEROL SULFATE (2.5 MG/3ML) 0.083% IN NEBU
INHALATION_SOLUTION | RESPIRATORY_TRACT | Status: AC
  Filled 2017-10-05: qty 6

## 2017-10-05 MED ORDER — FUROSEMIDE 10 MG/ML IJ SOLN
40.0000 mg | Freq: Once | INTRAMUSCULAR | Status: DC
  Filled 2017-10-05: qty 4

## 2017-10-05 MED ORDER — MIDAZOLAM HCL 2 MG/2ML IJ SOLN
2.0000 mg | Freq: Once | INTRAMUSCULAR | Status: AC
  Administered 2017-10-05: 2 mg via INTRAVENOUS

## 2017-10-05 MED ORDER — PROPOFOL 1000 MG/100ML IV EMUL
INTRAVENOUS | Status: AC
  Filled 2017-10-05: qty 100

## 2017-10-05 MED ORDER — FUROSEMIDE 10 MG/ML IJ SOLN: 40.0000 mg | Freq: Once | INTRAMUSCULAR | Status: DC

## 2017-10-05 MED ORDER — CHLORHEXIDINE GLUCONATE CLOTH 2 % EX PADS
6.0000 | MEDICATED_PAD | Freq: Every day | CUTANEOUS | Status: DC
  Administered 2017-10-07 – 2017-10-11 (×5): 6 via TOPICAL

## 2017-10-05 MED ORDER — HEPARIN SODIUM (PORCINE) 5000 UNIT/ML IJ SOLN
5000.0000 [IU] | Freq: Three times a day (TID) | INTRAMUSCULAR | Status: DC
  Administered 2017-10-05 – 2017-10-07 (×6): 5000 [IU] via SUBCUTANEOUS
  Filled 2017-10-05 (×6): qty 1

## 2017-10-05 MED ORDER — DEXTROSE 5 % IV SOLN
2.0000 g | INTRAVENOUS | Status: DC
  Administered 2017-10-05 – 2017-10-06 (×2): 2 g via INTRAVENOUS
  Filled 2017-10-05 (×3): qty 2

## 2017-10-05 MED ORDER — FENTANYL CITRATE (PF) 100 MCG/2ML IJ SOLN
100.0000 ug | Freq: Once | INTRAMUSCULAR | Status: AC
  Administered 2017-10-05: 100 ug via INTRAVENOUS

## 2017-10-05 MED ORDER — VANCOMYCIN HCL IN DEXTROSE 1-5 GM/200ML-% IV SOLN
1000.0000 mg | Freq: Once | INTRAVENOUS | Status: AC
  Administered 2017-10-05: 1000 mg via INTRAVENOUS
  Filled 2017-10-05: qty 200

## 2017-10-05 MED ORDER — ORAL CARE MOUTH RINSE
15.0000 mL | Freq: Four times a day (QID) | OROMUCOSAL | Status: DC
  Administered 2017-10-06 – 2017-10-12 (×26): 15 mL via OROMUCOSAL

## 2017-10-05 MED ORDER — VITAL HIGH PROTEIN PO LIQD
1000.0000 mL | ORAL | Status: DC
  Administered 2017-10-06: 1000 mL
  Filled 2017-10-05 (×2): qty 1000

## 2017-10-05 MED ORDER — ACETAMINOPHEN 325 MG PO TABS: 650.0000 mg | ORAL_TABLET | Freq: Four times a day (QID) | ORAL | Status: DC | PRN

## 2017-10-05 MED ORDER — PROPOFOL 1000 MG/100ML IV EMUL
5.0000 ug/kg/min | INTRAVENOUS | Status: DC
  Administered 2017-10-05: 40 ug/kg/min via INTRAVENOUS
  Administered 2017-10-05: 35 ug/kg/min via INTRAVENOUS
  Administered 2017-10-05: 25 ug/kg/min via INTRAVENOUS
  Administered 2017-10-06: 49.921 ug/kg/min via INTRAVENOUS
  Administered 2017-10-06: 50 ug/kg/min via INTRAVENOUS
  Administered 2017-10-06: 40 ug/kg/min via INTRAVENOUS
  Administered 2017-10-06: 25 ug/kg/min via INTRAVENOUS
  Administered 2017-10-06 – 2017-10-08 (×8): 50 ug/kg/min via INTRAVENOUS
  Administered 2017-10-08: 40 ug/kg/min via INTRAVENOUS
  Administered 2017-10-08: 50 ug/kg/min via INTRAVENOUS
  Administered 2017-10-09: 40 ug/kg/min via INTRAVENOUS
  Administered 2017-10-09: 30 ug/kg/min via INTRAVENOUS
  Administered 2017-10-10 – 2017-10-11 (×4): 45 ug/kg/min via INTRAVENOUS
  Filled 2017-10-05 (×24): qty 100

## 2017-10-05 MED ORDER — DILTIAZEM LOAD VIA INFUSION
10.0000 mg | Freq: Once | INTRAVENOUS | Status: AC
  Administered 2017-10-05: 10 mg via INTRAVENOUS
  Filled 2017-10-05: qty 10

## 2017-10-05 MED ORDER — PRO-STAT SUGAR FREE PO LIQD
30.0000 mL | Freq: Two times a day (BID) | ORAL | Status: DC
  Administered 2017-10-06 (×2): 30 mL
  Filled 2017-10-05 (×2): qty 30

## 2017-10-05 MED ORDER — FENTANYL CITRATE (PF) 100 MCG/2ML IJ SOLN
INTRAMUSCULAR | Status: AC
  Filled 2017-10-05: qty 2

## 2017-10-05 MED ORDER — ROCURONIUM BROMIDE 50 MG/5ML IV SOLN
20.0000 mg | Freq: Once | INTRAVENOUS | Status: AC
  Administered 2017-10-05: 20 mg via INTRAVENOUS
  Filled 2017-10-05: qty 2

## 2017-10-05 MED ORDER — DILTIAZEM HCL-DEXTROSE 100-5 MG/100ML-% IV SOLN (PREMIX)
5.0000 mg/h | INTRAVENOUS | Status: DC
  Administered 2017-10-05: 5 mg/h via INTRAVENOUS
  Filled 2017-10-05: qty 100

## 2017-10-05 MED ORDER — FENTANYL CITRATE (PF) 100 MCG/2ML IJ SOLN
100.0000 ug | INTRAMUSCULAR | Status: DC | PRN
  Administered 2017-10-05 – 2017-10-08 (×8): 100 ug via INTRAVENOUS
  Filled 2017-10-05 (×8): qty 2

## 2017-10-05 MED ORDER — DEXTROSE 5 % IV SOLN
2.0000 g | Freq: Once | INTRAVENOUS | Status: AC
  Administered 2017-10-05: 2 g via INTRAVENOUS
  Filled 2017-10-05: qty 2

## 2017-10-05 MED ORDER — FUROSEMIDE 10 MG/ML IJ SOLN
80.0000 mg | Freq: Three times a day (TID) | INTRAMUSCULAR | Status: DC
  Administered 2017-10-05 – 2017-10-06 (×3): 80 mg via INTRAVENOUS
  Filled 2017-10-05 (×3): qty 8

## 2017-10-05 MED ORDER — FUROSEMIDE 10 MG/ML IJ SOLN: 40.0000 mg | Freq: Two times a day (BID) | INTRAMUSCULAR | Status: DC

## 2017-10-05 MED ORDER — MIDAZOLAM HCL 2 MG/2ML IJ SOLN
INTRAMUSCULAR | Status: AC
  Filled 2017-10-05: qty 2

## 2017-10-05 MED ORDER — ALBUTEROL SULFATE (2.5 MG/3ML) 0.083% IN NEBU
5.0000 mg | INHALATION_SOLUTION | Freq: Once | RESPIRATORY_TRACT | Status: AC
  Administered 2017-10-05: 5 mg via RESPIRATORY_TRACT

## 2017-10-05 MED ORDER — CHLORHEXIDINE GLUCONATE 0.12% ORAL RINSE (MEDLINE KIT)
15.0000 mL | Freq: Two times a day (BID) | OROMUCOSAL | Status: DC
  Administered 2017-10-05 – 2017-10-11 (×12): 15 mL via OROMUCOSAL

## 2017-10-05 NOTE — Progress Notes (Signed)
Upon re-eval when patient moving to SDU, much more comfortable.  Weaned from 15L down to 5-6.  No current increased work of breathing.  Marlin CanaryJessica Vann DO

## 2017-10-05 NOTE — ED Notes (Signed)
She had been in no distress initially when I saw her and she was on the non-rebreather mask. We placed her on nasal cannula at 6 l.p.m. Per advisement of Dr. Benjamine MolaVann, who was here examining her.

## 2017-10-05 NOTE — ED Notes (Signed)
I have just phoned report to [facility] and will transport now.

## 2017-10-05 NOTE — ED Triage Notes (Signed)
Pt presents with c/o shortness of breath. Pt was recently discharged from the hospital after being admitted with pneumonia. Pt's family reports that after being discharged on Wednesday of this week, she started to get worse. Pt was 58% on RA upon arrival to ED.

## 2017-10-05 NOTE — Procedures (Signed)
Intubation Procedure Note Nichole Padilla [id number] Jun 16, 1927  Procedure: Intubation Indications: Respiratory insufficiency  Procedure Details Consent: Risks of procedure as well as the alternatives and risks of each were explained to the (patient/caregiver).  Consent for procedure obtained. Time Out: Verified patient identification, verified procedure, site/side was marked, verified correct patient position, special equipment/implants available, medications/allergies/relevent history reviewed, required imaging and test results available.  Performed  Maximum sterile technique was used including cap, gloves, gown and hand hygiene.  MAC and 3    Evaluation Hemodynamic Status: BP stable throughout; O2 sats: stable throughout Patient's Current Condition: stable Complications: No apparent complications Patient did tolerate procedure well. Chest X-ray ordered to verify placement.  CXR: pending.   Nichole Padilla 10/02/2017

## 2017-10-05 NOTE — ED Notes (Addendum)
While placing PT on bedpan PT 02 drop to low 70%. This Clinical research associatewriter a long with charge move PT up in bed. 02 84%, drop back down to 80% with movement. PT  nopw sitting up in bed 02 stat at 87%

## 2017-10-05 NOTE — Consult Note (Signed)
Name: Nichole Padilla MRN: [id number] DOB: [birth year]    ADMISSION DATE:  04/11/17 CONSULTATION DATE:  11.24/18  REFERRING MD :  Dr Benjamine MolaVann  CHIEF COMPLAINT:  Acute resp distress  BRIEF PATIENT DESCRIPTION: Increased wob  83 yr old, concerns chf, fib  SIGNIFICANT EVENTS  11/24 admit  STUDIES:    HISTORY OF PRESENT ILLNESS: 83 y.o. female with medical history significant of anemia due to [facility], recent hospitalization for PNA and discharged on ABX.  She was discharged and had increased sob.  Patient was found to be in a fib during last hospitalization, unsure of time frame for this. patient with a history of peptic ulcer disease as well as cirrhosis and not on blood thinners.  She [facility]. Had increased wob, fib rvr, distress requiring 100%. She had a CT chest with read as PNA rt greater left . Somewhat patchy rt greater left, Her PCXR today is much worsen bilateral fluffy edema infiltrates on top.,  Called to assess. No hemoptysis  PAST MEDICAL HISTORY :   has a past medical history of Anemia, Atrial fibrillation (HCC), Cirrhosis (HCC) (10/01/2017), GERD (gastroesophageal reflux disease) (09/30/2017), Gout, Hypertension, and Upper GI bleed.  has no past surgical history on file. Prior to Admission medications   Medication Sig Start Date End Date Taking? Authorizing Provider  amLODipine (NORVASC) 10 MG tablet Take 1 tablet (10 mg total) by mouth daily. 10/02/17  Yes Zannie CoveJoseph, Preetha, MD  amoxicillin-clavulanate (AUGMENTIN) 875-125 MG tablet Take 1 tablet by mouth every 12 (twelve) hours. For 4days 10/02/17  Yes Zannie CoveJoseph, Preetha, MD  Apoaequorin [facility](PREVAGEN EXTRA STRENGTH) 20 MG CAPS Take 20 mg daily by mouth.   Yes [provider]  Cholecalciferol (VITAMIN D3) 2000 units TABS Take 1 tablet daily by mouth.   Yes [provider]  furosemide (LASIX) 20 MG tablet Take 20 mg daily as needed by mouth for fluid or  edema.   Yes [provider]  Multiple Vitamin (MULTIVITAMIN WITH MINERALS) TABS tablet Take 1 tablet daily by mouth.   Yes [provider]  omeprazole (PRILOSEC) 20 MG capsule Take 20 mg daily by mouth.   Yes [provider]  traMADol-acetaminophen (ULTRACET) 37.5-325 MG tablet Take 1 tablet 2 (two) times daily as needed by mouth for moderate pain.   Yes [provider]   No Known Allergies  FAMILY HISTORY:  family history is not on file. SOCIAL HISTORY:  reports that  has never smoked. she has never used smokeless tobacco.  REVIEW OF SYSTEMS:   Constitutional: Negative for fever, chills, weight loss, malaise/fatigue and diaphoresis.  HENT: Negative for hearing loss, ear pain, nosebleeds, congestion, sore throat, neck pain, tinnitus and ear discharge.   Eyes: Negative for blurred vision, double vision, photophobia, pain, discharge and redness.  Respiratory: present with cough,NO  hemoptysis, min sputum production, POS shortness of breath, mild wheezing and stridor.   Cardiovascular: no cp now Gastrointestinal: Negative for heartburn, POS nausea, vomiting, abdominal pain, diarrhea, constipation, blood in stool and melena.  Genitourinary: Negative for dysuria, urgency, frequency, hematuria and flank pain.  Musculoskeletal: Negative for myalgias, back pain, joint pain and falls.  Skin: Negative for itching and rash.  Neurological: Negative for dizziness, tingling, tremors, sensory change, speech change, focal weakness, seizures, loss of consciousness, weakness and headaches.  Endo/Heme/Allergies: Negative for environmental allergies and polydipsia. Does not bruise/bleed easily.  SUBJECTIVE: 100% NRB  VITAL SIGNS: Temp:  [98.4  F (36.9 C)] 98.4 F (36.9 C) (11/24 0911) Pulse Rate:  [80-116] 80 (11/24 0637) Resp:  [18-28] 18 (11/24 0637) BP: (123-164)/(59-110) 143/60 (11/24 0637) SpO2:  [58 %-100 %] 93 % (11/24 0637) Weight:  [63.1 kg (139 lb 1.8  oz)] 63.1 kg (139 lb 1.8 oz) (11/24 0911)  PHYSICAL EXAMINATION: General:  Severe distress now Neuro:  A o x 3 HEENT:  jvd high Cardiovascular:  s1 s2 IRT flutter in and out Lungs:  Crackles, ronchi Abdomen:  Soft, BS wnl, no r Musculoskeletal:  Min edema Skin:  No rash  Recent Labs  Lab 10/01/17 1544 10/02/17 0608 04-Jun-2017 0354  NA 135 134* 134*  K 4.2 3.9 4.0  CL 102 102 102  CO2 22 21* 18*  BUN 40* 38* 27*  CREATININE 2.64* 2.42* 2.12*  GLUCOSE 173* 121* 234*   Recent Labs  Lab 10/01/17 0614 10/02/17 0608 04-Jun-2017 0354  HGB 7.7* 8.7* 8.8*  HCT 23.2* 26.1* 26.9*  WBC 15.1* 14.0* 22.4*  PLT 174 191 172   Dg Chest 2 View  Result Date: June 12, 2017 CLINICAL DATA:  Shortness of breath and recent pneumonia EXAM: CHEST  2 VIEW COMPARISON:  Chest radiograph 10/02/2017 FINDINGS: There has been marked worsening of confluent bilateral airspace opacities in a patchy distribution involving all regions of the lungs with the exception of mild left apical sparing. Cardiac silhouette is unchanged. No pneumothorax or sizable pleural effusion. IMPRESSION: Severe worsening of confluent airspace disease most consistent with bilateral multifocal pneumonia. Electronically Signed   By: Deatra RobinsonKevin  Herman M.D.   On: June 12, 2017 04:49    ASSESSMENT / PLAN:  Acute resp failure, favor pulm edema on top of prior infiltrates PNA, rule out extension of pna infectious vs noninfectious Aflutter, fib rvr Daist CHF from above ARF, ATN -requires emergent intubation -place line get cvp, may need ards, favor pulm edema, think cvp will be elevated -keep plat less 30 -Lasix  While we await cvp -ceftaz, vanc empiric -CT chest repeat when stable for this -cardizem drip to rate control -trop -echo just done reviewed -ppi -start feeds likely -will need peep likely, assess post intubation abg -send autoimmune noninfectious PNA panel -control rate -neg balance goals 2 liters, escalate lasix  dose -consider heparin drip if no bleeding in lungs or line -SSI -prop with NO WUA today  I updated daughters in full in room  Ccm time 85 min  Mcarthur Rossettianiel J. Tyson AliasFeinstein, MD, FACP Pgr: [phone] [facility]  Pulmonary and Critical Care Medicine [facility] Pager: [phone]  June 12, 2017, 11:29 AM

## 2017-10-05 NOTE — ED Notes (Signed)
Pt's Lactic Acid was 3.56. Eulis CannerNotified Glick, MD and RN.

## 2017-10-05 NOTE — ED Notes (Signed)
Patient transported to X-ray 

## 2017-10-05 NOTE — Progress Notes (Signed)
Notified MD Tyson AliasFeinstein in regards to patient's CVP being 15. MD Tyson AliasFeinstein ordered to place fentanyl PRN order 100 mcg q 2hrs.

## 2017-10-05 NOTE — Procedures (Signed)
Bronchoscopy Procedure Note Nichole Padilla [id number] [birth year]  Procedure: Bronchoscopy Indications: Diagnostic evaluation of the airways, Obtain specimens for culture and/or other diagnostic studies and Remove secretions  Procedure Details Consent: Risks of procedure as well as the alternatives and risks of each were explained to the (patient/caregiver).  Consent for procedure obtained. Time Out: Verified patient identification, verified procedure, site/side was marked, verified correct patient position, special equipment/implants available, medications/allergies/relevent history reviewed, required imaging and test results available.  Performed  In preparation for procedure, patient was given 100% FiO2 and bronchoscope lubricated. Sedation: propofol  Airway entered and the following bronchi were examined: RUL, RML, RLL, LUL, LLL and Bronchi.   Procedures performed: Brushings performed- no Bronchoscope removed.  , Patient placed back on 100% FiO2 at conclusion of procedure.    Evaluation Hemodynamic Status: BP stable throughout; O2 sats: stable throughout Patient's Current Condition: stable Specimens:  Sent serosanguinous fluid Complications: No apparent complications Patient did tolerate procedure well.   [name]. 09/26/2017   1. BAl RML, clear to serrous 2. NO pus at all 3. Frothy secretions 4. No mass lesions  Lavon Paganini. Titus Mould, MD, [place] P[facility]: [facility]

## 2017-10-05 NOTE — ED Provider Notes (Signed)
[facility]-EMERGENCY DEPT Provider Note   CSN: [id number] Arrival date & time: 09/23/2017  0340     History   Chief Complaint Chief Complaint  Patient presents with  . Shortness of Breath    HPI Nichole Padilla is a 83 y.o. female.  The history is provided by the patient and a relative.  She has history of cirrhosis, hypertension, GI bleed, renal insufficiency, atrial fibrillation.  She was discharged from the hospital 3 days ago after being admitted for healthcare associated pneumonia and acute on chronic renal failure.  She states that she had sudden onset of difficulty breathing tonight.  However, on further questioning, she was having some difficulty sleeping last night because of breathing.  She has a mild cough which is productive of a small amount of clear sputum.  She denies fever or chills or sweats.  She denies chest pain, heaviness, tightness, pressure.  She denies nausea or vomiting.  Dyspnea is worse with exertion or with lying flat.  Past Medical History:  Diagnosis Date  . Anemia   . Cirrhosis (HCC) 10/01/2017  . GERD (gastroesophageal reflux disease) 09/30/2017  . Gout   . Hypertension   . Upper GI bleed     Patient Active Problem List   Diagnosis Date Noted  . Renal failure (ARF), 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 10/01/2017  . Cirrhosis (HCC) 10/01/2017  . A-fib (HCC) 09/30/2017  . ARF (acute renal failure) (HCC) 09/30/2017  . HTN (hypertension) 09/30/2017  . Anemia 09/30/2017  . Gout 09/30/2017  . GERD (gastroesophageal reflux disease) 09/30/2017  . HCAP (healthcare-associated pneumonia)   . Healthcare-associated pneumonia 09/29/2017    History reviewed. No pertinent surgical history.  OB History    No data available       Home Medications    Prior to Admission medications   Medication Sig Start Date End Date Taking? Authorizing Provider  amLODipine (NORVASC) 10 MG tablet Take 1 tablet (10 mg total) by mouth daily.  10/02/17   Zannie CoveJoseph, Preetha, MD  amoxicillin-clavulanate (AUGMENTIN) 875-125 MG tablet Take 1 tablet by mouth every 12 (twelve) hours. For 4days 10/02/17   Zannie CoveJoseph, Preetha, MD  [facility](PREVAGEN EXTRA STRENGTH) 20 MG CAPS Take 20 mg daily by mouth.    [provider]  Cholecalciferol (VITAMIN D3) 2000 units TABS Take 1 tablet daily by mouth.    [provider]  furosemide (LASIX) 20 MG tablet Take 20 mg daily as needed by mouth for fluid or edema.    [provider]  Multiple Vitamin (MULTIVITAMIN WITH MINERALS) TABS tablet Take 1 tablet daily by mouth.    [provider]  omeprazole (PRILOSEC) 20 MG capsule Take 20 mg daily by mouth.    [provider]  traMADol-acetaminophen (ULTRACET) 37.5-325 MG tablet Take 1 tablet 2 (two) times daily as needed by mouth for moderate pain.    [provider]    Family History History reviewed. No pertinent family history.  Social History Social History   Tobacco Use  . Smoking status: Never Smoker  . Smokeless tobacco: Never Used  Substance Use Topics  . Alcohol use: Not on file  . Drug use: Not on file     Allergies   Patient has no known allergies.   Review of Systems Review of Systems  All other systems reviewed and are negative.    Physical Exam Updated Vital Signs BP (!) 164/110   Pulse (!) 116   Resp (!) 28   SpO2 (!) 58%  Physical Exam  Nursing note and vitals reviewed.  83 year old female, in mild respiratory distress. Vital signs are significant for tachycardia, hypertension, tachypnea. Oxygen saturation is 58%, which is hypoxic.  At the time that I am evaluating the patient, she is getting a nebulizer treatment and oxygen saturation is up to 91%.. Head is normocephalic and atraumatic. PERRLA, EOMI. Oropharynx is clear. Neck is nontender and supple without adenopathy.  JVD is present at 90 degrees. Back is nontender and there is no CVA tenderness. Lungs have  scattered expiratory wheezes without rales or rhonchi. Chest is nontender. Heart is tachycardic and irregular without murmur. Abdomen is soft, flat, nontender without masses or hepatosplenomegaly and peristalsis is normoactive. Extremities have 2+ edema, full range of motion is present. Skin is warm and dry without rash. Neurologic: Mental status is normal, cranial nerves are intact, there are no motor or sensory deficits.  ED Treatments / Results  Labs (all labs ordered are listed, but only abnormal results are displayed) Labs Reviewed  BASIC METABOLIC PANEL - Abnormal; Notable for the following components:      Result Value   Sodium 134 (*)    CO2 18 (*)    Glucose, Bld 234 (*)    BUN 27 (*)    Creatinine, Ser 2.12 (*)    GFR calc non Af Amer 20 (*)    GFR calc Af Amer 23 (*)    All other components within normal limits  CBC - Abnormal; Notable for the following components:   WBC 22.4 (*)    RBC 3.03 (*)    Hemoglobin 8.8 (*)    HCT 26.9 (*)    RDW 17.7 (*)    All other components within normal limits  I-STAT CG4 LACTIC ACID, ED - Abnormal; Notable for the following components:   Lactic Acid, Venous 3.56 (*)    All other components within normal limits  CULTURE, BLOOD (ROUTINE X 2)  CULTURE, BLOOD (ROUTINE X 2)  BRAIN NATRIURETIC PEPTIDE  DIFFERENTIAL  MAGNESIUM  I-STAT TROPONIN, ED  I-STAT CG4 LACTIC ACID, ED    EKG  EKG Interpretation  Date/Time:  Saturday October 05 2017 03:56:59 EST Ventricular Rate:  120 PR Interval:    QRS Duration: 53 QT Interval:  322 QTC Calculation: 428 R Axis:   73 Text Interpretation:  Atrial fibrillation with rapid ventricular response Multiform ventricular premature complexes Consider anterior infarct Minimal ST depression, inferior leads When compared with ECG of 09/29/2017, Premature ventricular complexes are now present Confirmed by Dione BoozeGlick, Nichole Padilla ([id number]) on 10/01/2017 4:09:41 AM       Radiology Dg Chest 2 View  Result  Date: 09/16/2017 CLINICAL DATA:  Shortness of breath and recent pneumonia EXAM: CHEST  2 VIEW COMPARISON:  Chest radiograph 10/02/2017 FINDINGS: There has been marked worsening of confluent bilateral airspace opacities in a patchy distribution involving all regions of the lungs with the exception of mild left apical sparing. Cardiac silhouette is unchanged. No pneumothorax or sizable pleural effusion. IMPRESSION: Severe worsening of confluent airspace disease most consistent with bilateral multifocal pneumonia. Electronically Signed   By: Deatra RobinsonKevin  Herman M.D.   On: 10/02/2017 04:49    Procedures Procedures (including critical care time) CRITICAL CARE Performed by: Dione Boozeavid Tammela Bales Total critical care time: 90 minutes Critical care time was exclusive of separately billable procedures and treating other patients. Critical care was necessary to treat or prevent imminent or life-threatening deterioration. Critical care was time spent personally by me on the following activities: development of  treatment plan with patient and/or surrogate as well as nursing, discussions with consultants, evaluation of patient's response to treatment, examination of patient, obtaining history from patient or surrogate, ordering and performing treatments and interventions, ordering and review of laboratory studies, ordering and review of radiographic studies, pulse oximetry and re-evaluation of patient's condition.  Medications Ordered in ED Medications  diltiazem (CARDIZEM) 1 mg/mL load via infusion 10 mg (10 mg Intravenous Bolus from Bag 09/16/2017 0434)    And  diltiazem (CARDIZEM) 100 mg in dextrose 5% (1 mg/mL) infusion (5 mg/hr Intravenous New Bag/Given 09/25/2017 0434)  vancomycin (VANCOCIN) IVPB 1000 mg/200 mL premix (1,000 mg Intravenous New Bag/Given 09/29/2017 0715)  albuterol (PROVENTIL) (2.5 MG/3ML) 0.083% nebulizer solution 5 mg (5 mg Nebulization Not Given 09/29/2017 0415)  ceFEPIme (MAXIPIME) 2 g in dextrose 5 %  50 mL IVPB (0 g Intravenous Stopped 10/07/2017 0713)  furosemide (LASIX) injection 40 mg (40 mg Intravenous Given 09/29/2017 1610)     Initial Impression / Assessment and Plan / ED Course  I have reviewed the triage vital signs and the nursing notes.  Pertinent labs & imaging results that were available during my care of the patient were reviewed by me and considered in my medical decision making (see chart for details).  Acute dyspnea in the setting of recent hospitalization for healthcare associated pneumonia.  Consider superimposed infection.  Wheezing is noted, consider bronchospasm.  However, physical exam strongly suggest that her problem is acute on chronic heart failure given peripheral edema and presence of neck vein distention.  Progress note on November 20 stated no peripheral edema.  Will check chest x-ray and BNP as well as basic metabolic panel and troponin.  She will need medication for rate control.  Heart rate has come down with diltiazem bolus and drip.  Chest x-ray is read by radiologist as multifocal pneumonia.  I suspect that it is actually worsening heart failure.  Lactic acid level has come back mildly elevated.  I do not feel this is from sepsis and feel it is more likely related to combination of tachyarrhythmia and hypoxia.  I had further discussion with patient's family and she did eat a high sodium load yesterday.  She has been put back on antibiotics for healthcare associated pneumonia-vancomycin and cefepime.  She has been given a dose of furosemide.  BNP had been ordered, but apparently laboratory never got the notice.  They are running BNP currently.  Since I believe that her main problem is volume overload, I am not giving fluid bolus that would normally be given for elevated lactate of sepsis.  Case has been discussed with Dr. Sharl Ma of [facility], who agrees to admit the patient.  Final Clinical Impressions(s) / ED Diagnoses   Final diagnoses:  Hypoxia  Acute on  chronic heart failure, unspecified heart failure type (HCC)  HCAP (healthcare-associated pneumonia)  Atrial fibrillation with rapid ventricular response (HCC)  Renal insufficiency  Normochromic normocytic anemia  Elevated lactic acid level    ED Discharge Orders    None       Dione Booze, MD 09/15/2017 [phone]

## 2017-10-05 NOTE — Progress Notes (Signed)
Pharmacy Antibiotic Note  Nichole SellsConstance Padilla is a 83 y.o. female admitted on 10/01/2017 with HCAP.   [place] discharged on augmentin. Pt received vanc and cefepime x 1 in ED. Pharmacy has been consulted for ceftazidime dosing.  Plan: Ceftazidime 2gm IV q24h  Follow renal function, cultures and clinical course  Height: 5\' 3"  (160 cm) Weight: 139 lb 1.8 oz (63.1 kg) IBW/kg (Calculated) : 52.4  Temp (24hrs), Avg:98.4 F (36.9 C), Min:98.4 F (36.9 C), Max:98.4 F (36.9 C)  Recent Labs  Lab 09/29/17 1453 09/29/17 1813 09/30/17 0506 10/01/17 0614 10/01/17 1544 10/02/17 0608 09/15/2017 0354 09/14/2017 0617  WBC 11.1*  --  7.4 15.1*  --  14.0* 22.4*  --   CREATININE 1.66*  --  1.74* 2.47* 2.64* 2.42* 2.12*  --   LATICACIDVEN  --  1.80  --   --   --   --   --  3.56*    Estimated Creatinine Clearance: 16.7 mL/min (A) (by C-G formula based on SCr of 2.12 mg/dL (H)).    No Known Allergies  Antimicrobials this admission: 11/24 vanc x 1 11/24 cefepime x 1 11/24  Ceftazidime >>  Dose adjustments this admission:   Microbiology results: 11/24 BCx: sent 11/24 MRSA PCR: neg  Thank you for allowing pharmacy to be a part of this patient's care.  Nichole Padilla RPh 10/09/2017, 11:48 AM Pager [phone]

## 2017-10-05 NOTE — Progress Notes (Signed)
CRITICAL VALUE ALERT  Critical Value:  0.06 troponin  Date & Time Notied:  2017-06-22 1803H  Provider Notified: MD paged  Orders Received/Actions taken: Awaiting orders  Burna SisMichell G Zavaleta Catalan, RN

## 2017-10-05 NOTE — H&P (Signed)
History and Physical    Nichole SellsConstance Padilla GNF:[id number] DOB: 1926-11-18 DOA: 10/08/2017  Referring MD/NP/PA: er PCP: Patient, No Pcp Per Outpatient Specialists:  Patient coming from: home  Chief Complaint: weak/SOB  HPI: Nichole Padilla is a 83 y.o. female with medical history significant of anemia [facility], recent hospitalization for PNA and discharged on augmentin.  Per family patient is very active at baseline.  She was discharged on Wednesday and did well until Friday when she became very weak and had worsening SOB.  Patient was found to be in a fib during last hospitalization, unsure of time frame for this-- appears new but per PCP: patient with a history of peptic ulcer disease as well as cirrhosis and not a good anticoagulation candidate.  She presented back in a fib with RVR today.  When she arrived in the ER, her O2 sats were 58%.  She was started in cardizem and had good HR response.  Her O2 sat remain good on 4L unless she moves and then they drop in the 70s.  ER physician suspected worsening pulmonary edema and patient given lasix- no output recorded.  WBC count was found to be elevated as well.  Patient was restarted back on IV abx.  Hospitalist were asked to admit.  Initially in the ER patient appeared to be in respiratory distress but has since improved with IV lasix and IV abx.  Review of Systems: all systems reviewed, negative unless stated above in HPI   Past Medical History:  Diagnosis Date  . Anemia   . Cirrhosis (HCC) 10/01/2017  . GERD (gastroesophageal reflux disease) 09/30/2017  . Gout   . Hypertension   . Upper GI bleed     History reviewed. No pertinent surgical history.   reports that  has never smoked. she has never used smokeless tobacco. Her alcohol and drug histories are not on file.  No Known Allergies  Family Hx: per daughter: HTN  Prior to Admission medications   Medication Sig Start Date End Date Taking?  Authorizing Provider  amLODipine (NORVASC) 10 MG tablet Take 1 tablet (10 mg total) by mouth daily. 10/02/17  Yes Zannie CoveJoseph, Preetha, MD  amoxicillin-clavulanate (AUGMENTIN) 875-125 MG tablet Take 1 tablet by mouth every 12 (twelve) hours. For 4days 10/02/17  Yes Zannie CoveJoseph, Preetha, MD  [facility](PREVAGEN EXTRA STRENGTH) 20 MG CAPS Take 20 mg daily by mouth.   Yes [provider]  Cholecalciferol (VITAMIN D3) 2000 units TABS Take 1 tablet daily by mouth.   Yes [provider]  furosemide (LASIX) 20 MG tablet Take 20 mg daily as needed by mouth for fluid or edema.   Yes [provider]  Multiple Vitamin (MULTIVITAMIN WITH MINERALS) TABS tablet Take 1 tablet daily by mouth.   Yes [provider]  omeprazole (PRILOSEC) 20 MG capsule Take 20 mg daily by mouth.   Yes [provider]  traMADol-acetaminophen (ULTRACET) 37.5-325 MG tablet Take 1 tablet 2 (two) times daily as needed by mouth for moderate pain.   Yes [provider]    Physical Exam: Vitals:   10/11/2017 0600 10/11/2017 0630 09/20/2017 0637 10/10/2017 0911  BP: (!) 162/63 (!) 143/60 (!) 143/60   Pulse: 80  80   Resp: (!) 24 (!) 26 18   Temp:    98.4 F (36.9 C)  TempSrc:    Oral  SpO2: 98% 98% 93%   Weight:    63.1 kg (139 lb 1.8 oz)  Height:  5\' 3"  (1.6 m)      Constitutional: increased work of breathing, appears fatigued, on bedpan Vitals:   10/04/2017 0600 09/20/2017 0630 09/23/2017 0637 10/04/2017 0911  BP: (!) 162/63 (!) 143/60 (!) 143/60   Pulse: 80  80   Resp: (!) 24 (!) 26 18   Temp:    98.4 F (36.9 C)  TempSrc:    Oral  SpO2: 98% 98% 93%   Weight:    63.1 kg (139 lb 1.8 oz)  Height:    5\' 3"  (1.6 m)   Eyes: PERRL, lids and conjunctivae normal ENMT: Mucous membranes are moist. Posterior pharynx clear of any exudate or lesions.Normal dentition.  Neck: normal, supple, no masses, no thyromegaly Respiratory: diminished with wheezing Cardiovascular: irr and fast, + LE  edema Abdomen: no tenderness, no masses palpated. No hepatosplenomegaly. Bowel sounds positive.  Musculoskeletal: moved all 4 ext.  Skin: no rashes, lesions, ulcers. No induration Neurologic: CN 2-12 grossly intact. Sensation intact, DTR normal. Strength 5/5 in all 4.  Psychiatric: Normal judgment and insight. Alert and oriented x 3. Normal mood.     Labs on Admission: I have personally reviewed following labs and imaging studies  CBC: Recent Labs  Lab 09/29/17 1453 09/30/17 0506 10/01/17 0614 10/02/17 0608 09/21/2017 0354 09/28/2017 0356  WBC 11.1* 7.4 15.1* 14.0* 22.4*  --   NEUTROABS  --   --   --   --   --  18.0*  HGB 7.0* 7.8* 7.7* 8.7* 8.8*  --   HCT 21.9* 23.4* 23.2* 26.1* 26.9*  --   MCV 94.4 88.3 88.5 88.2 88.8  --   PLT 192 175 174 191 172  --    Basic Metabolic Panel: Recent Labs  Lab 09/29/17 2235 09/30/17 0506 10/01/17 0614 10/01/17 1544 10/02/17 0608 09/18/2017 0354 10/04/2017 0356  NA  --  133* 134* 135 134* 134*  --   K  --  4.3 4.6 4.2 3.9 4.0  --   CL  --  102 101 102 102 102  --   CO2  --  20* 22 22 21* 18*  --   GLUCOSE  --  197* 122* 173* 121* 234*  --   BUN  --  21* 36* 40* 38* 27*  --   CREATININE  --  1.74* 2.47* 2.64* 2.42* 2.12*  --   CALCIUM  --  8.6* 8.4* 8.4* 8.5* 9.5  --   MG 1.9  --   --   --   --   --  2.1  PHOS 3.7  --   --   --   --   --   --    GFR: Estimated Creatinine Clearance: 16.7 mL/min (A) (by C-G formula based on SCr of 2.12 mg/dL (H)). Liver Function Tests: Recent Labs  Lab 09/29/17 1740  AST 31  ALT 13*  ALKPHOS 80  BILITOT 0.5  PROT 7.2  ALBUMIN 3.1*   Recent Labs  Lab 09/29/17 1740  LIPASE 33   No results for input(s): AMMONIA in the last 168 hours. Coagulation Profile: Recent Labs  Lab 09/29/17 2235  INR 1.24   Cardiac Enzymes: Recent Labs  Lab 09/29/17 2235 09/30/17 0506  TROPONINI 0.04* 0.03*   BNP (last 3 results) No results for input(s): PROBNP in the last 8760 hours. HbA1C: No results for  input(s): HGBA1C in the last 72 hours. CBG: No results for input(s): GLUCAP in the last 168 hours. Lipid Profile: No results for input(s): CHOL, HDL, LDLCALC,  TRIG, CHOLHDL, LDLDIRECT in the last 72 hours. Thyroid Function Tests: No results for input(s): TSH, T4TOTAL, FREET4, T3FREE, THYROIDAB in the last 72 hours. Anemia Panel: No results for input(s): VITAMINB12, FOLATE, FERRITIN, TIBC, IRON, RETICCTPCT in the last 72 hours. Urine analysis:    Component Value Date/Time   COLORURINE YELLOW 09/29/2017 1354   APPEARANCEUR CLEAR 09/29/2017 1354   LABSPEC 1.008 09/29/2017 1354   PHURINE 6.0 09/29/2017 1354   GLUCOSEU NEGATIVE 09/29/2017 1354   HGBUR NEGATIVE 09/29/2017 1354   BILIRUBINUR NEGATIVE 09/29/2017 1354   KETONESUR NEGATIVE 09/29/2017 1354   PROTEINUR NEGATIVE 09/29/2017 1354   NITRITE NEGATIVE 09/29/2017 1354   LEUKOCYTESUR NEGATIVE 09/29/2017 1354   Sepsis Labs: Invalid input(s): PROCALCITONIN, LACTICIDVEN Recent Results (from the past 240 hour(s))  Blood culture (routine x 2)     Status: None (Preliminary result)   Collection Time: 09/29/17  5:58 PM  Result Value Ref Range Status   Specimen Description BLOOD RIGHT ANTECUBITAL  Final   Special Requests   Final    BOTTLES DRAWN AEROBIC AND ANAEROBIC Blood Culture adequate volume   Culture   Final    NO GROWTH 4 DAYS Performed at [facility], [address], [place], [place] [address]    Report Status PENDING  Incomplete  Blood culture (routine x 2)     Status: None (Preliminary result)   Collection Time: 09/29/17  9:42 PM  Result Value Ref Range Status   Specimen Description BLOOD LEFT HAND  Final   Special Requests IN PEDIATRIC BOTTLE Blood Culture adequate volume  Final   Culture   Final    NO GROWTH 4 DAYS Performed at [facility], [address] [place], [place] [address]    Report Status PENDING  Incomplete  Respiratory Panel by PCR     Status: None   Collection Time: 09/29/17  9:59 PM    Result Value Ref Range Status   Adenovirus NOT DETECTED NOT DETECTED Final   Coronavirus 229E NOT DETECTED NOT DETECTED Final   Coronavirus HKU1 NOT DETECTED NOT DETECTED Final   Coronavirus NL63 NOT DETECTED NOT DETECTED Final   Coronavirus OC43 NOT DETECTED NOT DETECTED Final   Metapneumovirus NOT DETECTED NOT DETECTED Final   Rhinovirus / Enterovirus NOT DETECTED NOT DETECTED Final   Influenza A NOT DETECTED NOT DETECTED Final   Influenza B NOT DETECTED NOT DETECTED Final   Parainfluenza Virus 1 NOT DETECTED NOT DETECTED Final   Parainfluenza Virus 2 NOT DETECTED NOT DETECTED Final   Parainfluenza Virus 3 NOT DETECTED NOT DETECTED Final   Parainfluenza Virus 4 NOT DETECTED NOT DETECTED Final   Respiratory Syncytial Virus NOT DETECTED NOT DETECTED Final   Bordetella pertussis NOT DETECTED NOT DETECTED Final   Chlamydophila pneumoniae NOT DETECTED NOT DETECTED Final   Mycoplasma pneumoniae NOT DETECTED NOT DETECTED Final    Comment: Performed at [facility], [address], [place], [place] [address]  Culture, sputum-assessment     Status: None   Collection Time: 09/30/17  9:07 AM  Result Value Ref Range Status   Specimen Description SPUTUM  Final   Special Requests Normal  Final   Sputum evaluation   Final    Sputum specimen not acceptable for testing.  Please recollect.   INFORMED FRASER,B @ 1051 ON [id number] BY POTEAT,S   Report Status 09/30/2017 FINAL  Final  MRSA PCR Screening     Status: None   Collection Time: 10/01/17  8:31 AM  Result  Value Ref Range Status   MRSA by PCR NEGATIVE NEGATIVE Final    Comment:        The GeneXpert MRSA Assay (FDA approved for NASAL specimens only), is one component of a comprehensive MRSA colonization surveillance program. It is not intended to diagnose MRSA infection nor to guide or monitor treatment for MRSA infections.      Radiological Exams on Admission: Dg Chest 2 View  Result Date: 10/06/2017 CLINICAL DATA:  Shortness  of breath and recent pneumonia EXAM: CHEST  2 VIEW COMPARISON:  Chest radiograph 10/02/2017 FINDINGS: There has been marked worsening of confluent bilateral airspace opacities in a patchy distribution involving all regions of the lungs with the exception of mild left apical sparing. Cardiac silhouette is unchanged. No pneumothorax or sizable pleural effusion. IMPRESSION: Severe worsening of confluent airspace disease most consistent with bilateral multifocal pneumonia. Electronically Signed   By: Deatra RobinsonKevin  Herman M.D.   On: 09/24/2017 04:49    EKG: Independently reviewed. 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   Acute respiratory failure with hypoxemia (HCC)  HCAP +/- pulm edema from a fib with RVR -Severe worsening of b/l airspace disease on x ray -Patient with severe desaturation with movement in bed-- down into the 70s on 4L [place] -IV Abx for HCAP-- was d/c'd on augmentin which patient took -IV lasix for possible pulm edema- BNP elevated -blood cultures and NP swab negative from prior admission -echo from 11/18: normal EF- unable to assess diastolic due to a fib, moderate AS -PCCM consult   A fib with RVR -cardiazem gtt-- was not given rate control agent at last d/c as HR was controlled in hospital off meds per d/c summary -deemed not a candidate for anticoagulation last hospitalization ([facility] ? [facility] GI bleed)  Acute resp failure -wean O2 as tolerated -patient with increase work of breathing and fatigue on initial exam but this was after being placed on bedpan -upon re-eval much improved and back to [place] -does not wear O2 at home  AKI on CKD -unsure of baseline -continue IV Lasix and monitor Cr   DVT prophylaxis: lovenox Code Status: full Family Communication: patient Disposition Plan: ? Consults called: PCCM- Dr.  Tyson AliasFeinstein Admission status: inpt- SDU   Joseph ArtJessica U Aphrodite Harpenau DO [facility] Pager [phone]  If 7PM-7AM, please contact night-coverage [email or web address] Password [email or web address]  09/25/2017, 9:36 AM

## 2017-10-05 NOTE — Progress Notes (Signed)
  Echocardiogram 2D Echocardiogram has been performed.  Nichole Padilla, Nichole Padilla 09/26/2017, 4:22 PM

## 2017-10-05 NOTE — Procedures (Signed)
Central Venous Catheter Insertion Procedure Note Nita SellsConstance Richner [id number] Apr 09, 1927  Procedure: Insertion of Central Venous Catheter Indications: cvp  Procedure Details Consent: Risks of procedure as well as the alternatives and risks of each were explained to the (patient/caregiver).  Consent for procedure obtained. Time Out: Verified patient identification, verified procedure, site/side was marked, verified correct patient position, special equipment/implants available, medications/allergies/relevent history reviewed, required imaging and test results available.  Performed  Maximum sterile technique was used including antiseptics, cap, gloves, gown, hand hygiene, mask and sheet. Skin prep: Chlorhexidine; local anesthetic administered A antimicrobial bonded/coated triple lumen catheter was placed in the right internal jugular vein using the Seldinger technique.  Evaluation Blood flow good Complications: No apparent complications Patient did tolerate procedure well. Chest X-ray ordered to verify placement.  CXR: pending.  Nelda BucksFEINSTEIN,DANIEL J. 10/11/2017, 12:55 PM  US  Mcarthur Rossettianiel J. Tyson AliasFeinstein, MD, FACP Pgr: [phone] [place] [facility]

## 2017-10-06 ENCOUNTER — Inpatient Hospital Stay (HOSPITAL_COMMUNITY): Payer: Medicare Other

## 2017-10-06 LAB — BASIC METABOLIC PANEL
Anion gap: 9 (ref 5–15)
Anion gap: 11 (ref 5–15)
BUN: 35 mg/dL — AB (ref 6–20)
BUN: 44 mg/dL — AB (ref 6–20)
CALCIUM: 8.4 mg/dL — AB (ref 8.9–10.3)
CALCIUM: 8.5 mg/dL — AB (ref 8.9–10.3)
CO2: 24 mmol/L (ref 22–32)
CO2: 22 mmol/L (ref 22–32)
CREATININE: 2.39 mg/dL — AB (ref 0.44–1.00)
CREATININE: 2.83 mg/dL — AB (ref 0.44–1.00)
Chloride: 102 mmol/L (ref 101–111)
Chloride: 103 mmol/L (ref 101–111)
GFR calc Af Amer: 16 mL/min — ABNORMAL LOW (ref >60)
GFR calc non Af Amer: 17 mL/min — ABNORMAL LOW (ref >60)
GFR calc non Af Amer: 14 mL/min — ABNORMAL LOW (ref >60)
GFR, EST AFRICAN AMERICAN: 20 mL/min — AB (ref >60)
GLUCOSE: 178 mg/dL — AB (ref 65–99)
Glucose, Bld: 145 mg/dL — ABNORMAL HIGH (ref 65–99)
Potassium: 3.2 mmol/L — ABNORMAL LOW (ref 3.5–5.1)
Potassium: 3.3 mmol/L — ABNORMAL LOW (ref 3.5–5.1)
SODIUM: 135 mmol/L (ref 135–145)
Sodium: 136 mmol/L (ref 135–145)

## 2017-10-06 LAB — GLUCOSE, CAPILLARY
GLUCOSE-CAPILLARY: 138 mg/dL — AB (ref 65–99)
GLUCOSE-CAPILLARY: 157 mg/dL — AB (ref 65–99)
GLUCOSE-CAPILLARY: 141 mg/dL — AB (ref 65–99)
GLUCOSE-CAPILLARY: 168 mg/dL — AB (ref 65–99)
GLUCOSE-CAPILLARY: 164 mg/dL — AB (ref 65–99)
Glucose-Capillary: 150 mg/dL — ABNORMAL HIGH (ref 65–99)
Glucose-Capillary: 130 mg/dL — ABNORMAL HIGH (ref 65–99)

## 2017-10-06 LAB — BLOOD GAS, ARTERIAL
ACID-BASE DEFICIT: 2.3 mmol/L — AB (ref 0.0–2.0)
Bicarbonate: 21.7 mmol/L (ref 20.0–28.0)
Drawn by: 103701
FIO2: 50
MECHVT: 400 mL
O2 Saturation: 93.5 %
PATIENT TEMPERATURE: 99.4
PEEP/CPAP: 5 cmH2O
PH ART: 7.39 (ref 7.350–7.450)
PO2 ART: 74.4 mmHg — AB (ref 83.0–108.0)
RATE: 26 resp/min
pCO2 arterial: 36.8 mmHg (ref 32.0–48.0)

## 2017-10-06 LAB — FERRITIN: Ferritin: 797 ng/mL — ABNORMAL HIGH (ref 11–307)

## 2017-10-06 LAB — CBC
HCT: 21.6 % — ABNORMAL LOW (ref 36.0–46.0)
Hemoglobin: 7.2 g/dL — ABNORMAL LOW (ref 12.0–15.0)
MCH: 30 pg (ref 26.0–34.0)
MCHC: 33.3 g/dL (ref 30.0–36.0)
MCV: 90 fL (ref 78.0–100.0)
PLATELETS: 105 10*3/uL — AB (ref 150–400)
RBC: 2.4 MIL/uL — AB (ref 3.87–5.11)
RDW: 17.7 % — AB (ref 11.5–15.5)
WBC: 13.8 10*3/uL — AB (ref 4.0–10.5)

## 2017-10-06 LAB — PHOSPHORUS
Phosphorus: 4.7 mg/dL — ABNORMAL HIGH (ref 2.5–4.6)
Phosphorus: 4.7 mg/dL — ABNORMAL HIGH (ref 2.5–4.6)

## 2017-10-06 LAB — MAGNESIUM
MAGNESIUM: 1.9 mg/dL (ref 1.7–2.4)
MAGNESIUM: 2 mg/dL (ref 1.7–2.4)

## 2017-10-06 LAB — RETICULOCYTES
RBC.: 2.6 MIL/uL — AB (ref 3.87–5.11)
RETIC COUNT ABSOLUTE: 148.2 10*3/uL (ref 19.0–186.0)
Retic Ct Pct: 5.7 % — ABNORMAL HIGH (ref 0.4–3.1)

## 2017-10-06 LAB — TROPONIN I: Troponin I: 0.05 ng/mL (ref <0.03)

## 2017-10-06 LAB — IRON AND TIBC
Iron: 50 ug/dL (ref 28–170)
Saturation Ratios: 19 % (ref 10.4–31.8)
TIBC: 267 ug/dL (ref 250–450)
UIBC: 217 ug/dL

## 2017-10-06 LAB — D-DIMER, QUANTITATIVE (NOT AT ARMC)

## 2017-10-06 MED ORDER — VITAL HIGH PROTEIN PO LIQD
1000.0000 mL | ORAL | Status: DC
  Administered 2017-10-06 – 2017-10-07 (×3): 1000 mL
  Filled 2017-10-06 (×3): qty 1000

## 2017-10-06 MED ORDER — POTASSIUM CHLORIDE 20 MEQ/15ML (10%) PO SOLN
40.0000 meq | Freq: Once | ORAL | Status: AC
  Administered 2017-10-06: 40 meq
  Filled 2017-10-06: qty 30

## 2017-10-06 MED ORDER — PRO-STAT SUGAR FREE PO LIQD
30.0000 mL | Freq: Every day | ORAL | Status: DC
  Administered 2017-10-07 – 2017-10-08 (×2): 30 mL
  Filled 2017-10-06 (×2): qty 30

## 2017-10-06 MED ORDER — POTASSIUM CHLORIDE 20 MEQ/15ML (10%) PO SOLN
40.0000 meq | Freq: Once | ORAL | Status: AC
  Administered 2017-10-07: 40 meq
  Filled 2017-10-06: qty 30

## 2017-10-06 MED ORDER — FUROSEMIDE 10 MG/ML IJ SOLN
80.0000 mg | Freq: Two times a day (BID) | INTRAMUSCULAR | Status: DC
  Administered 2017-10-06 – 2017-10-07 (×2): 80 mg via INTRAVENOUS
  Filled 2017-10-06 (×2): qty 8

## 2017-10-06 NOTE — Progress Notes (Signed)
eLink Physician-Brief Progress Note Patient Name: Nichole SellsConstance Mierzejewski DOB: 24-May-1927 MRN: [id number]   Date of Service  10/06/2017  HPI/Events of Note    eICU Interventions  Hypokalemia -repleted      Intervention Category Intermediate Interventions: Electrolyte abnormality - evaluation and management  Rakesh V. Alva 10/06/2017, 11:56 PM

## 2017-10-06 NOTE — Progress Notes (Signed)
Initial Nutrition Assessment  INTERVENTION:   Initiate Vital HP @ 35 ml/hr with 30 ml Prostat daily. Tube feeding + current Propofol infusion will provide 1438 kcal, 88g protein and 702 ml H2O.  RD will continue to monitor  NUTRITION DIAGNOSIS:   Inadequate oral intake related to inability to eat as evidenced by NPO status.  GOAL:   Patient will meet greater than or equal to 90% of their needs  MONITOR:   Labs, Weight trends, Vent status, TF tolerance, I & O's  REASON FOR ASSESSMENT:   Consult, Ventilator Enteral/tube feeding initiation and management  ASSESSMENT:    83 y.o. female with medical history significant of anemia due [facility], recent hospitalization for PNA and discharged on augmentin.  Per family patient is very active at baseline.  She was discharged on Wednesday and did well until Friday when she became very weak and had worsening SOB.  Patient was found to be in a fib during last hospitalization, unsure of time frame for this-- appears new but per PCP: patient with a history of peptic ulcer disease as well as cirrhosis and not a good anticoagulation candidate.    [facility] Since that admission, intakes and appetite did not improve. Pt emergently intubated 11/24. Prior to previous admission, pt was c/o poor appetite.  Patient is currently intubated on ventilator support MV: 9.1 L/min Temp (24hrs), Avg:97.8 F (36.6 C), Min:97.3 F (36.3 C), Max:99.4 F (37.4 C)  Propofol: 18.9 ml/hr -provides 498 kcal  Labs reviewed: CBGs: 138-157 Low K Mg WNL Elevated Phos  NUTRITION - FOCUSED PHYSICAL EXAM:    Most Recent Value  Orbital Region  No depletion  Upper Arm Region  No depletion  Thoracic and Lumbar Region  Unable to assess  Buccal Region  No depletion  Temple Region  No depletion  Clavicle Bone Region  No depletion  Clavicle and Acromion Bone Region  No depletion  Scapular Bone Region  Unable  to assess  Dorsal Hand  No depletion  Patellar Region  No depletion  Anterior Thigh Region  No depletion  Posterior Calf Region  Unable to assess  Edema (RD Assessment)  Moderate       Diet Order:  Diet NPO time specified Except for: Sips with Meds  EDUCATION NEEDS:   No education needs have been identified at this time  Skin:  Skin Assessment: Reviewed RN Assessment  Last BM:  PTA  Height:   Ht Readings from Last 1 Encounters:  2017/05/04 5\' 3"  (1.6 m)    Weight:   Wt Readings from Last 1 Encounters:  10/06/17 146 lb 13.2 oz (66.6 kg)    Ideal Body Weight:  52.3 kg  BMI:  Body mass index is 26.01 kg/m.  Estimated Nutritional Needs:   Kcal:  1350  Protein:  90-100g  Fluid:  1.5L/day  Tilda FrancoLindsey Mathew Storck, MS, RD, LDN [facility] Clinical Dietitian Pager: [phone] After Hours Pager: [phone]

## 2017-10-06 NOTE — Progress Notes (Addendum)
Name: Nichole Padilla MRN: [id number] DOB: [birth year]    ADMISSION DATE:  10/07/2017 CONSULTATION DATE:  11.24/18  REFERRING MD :  Dr Eliseo Squires  CHIEF COMPLAINT:  Acute resp distress  BRIEF PATIENT DESCRIPTION: Increased wob  83 yr old, concerns chf, fib  SIGNIFICANT EVENTS  11/24 admit, declined, ett, lasix  STUDIES:  ECHO 11/24>>>Left ventricle: The cavity size was normal. Wall thickness was   normal. Systolic function was normal. The estimated ejection   fraction was in the range of 55% to 60%. Features are consistent   with a pseudonormal left ventricular filling pattern, with   concomitant abnormal relaxation and increased filling pressure   (grade 2 diastolic dysfunction). - Aortic valve: Cusp separation was reduced. There was mild to   moderate stenosis. Valve area (VTI): 0.85 cm^2. Valve area   (Vmax): 0.95 cm^2. Valve area (Vmean): 0.94 cm^2. - Mitral valve: Calcified annulus. Mobility of the posterior   leaflet was restricted. There was mild regurgitation. - Left atrium: The atrium was moderately dilated. - Right ventricle: The cavity size was moderately dilated. Wall   thickness was normal. - Right atrium: The atrium was moderately dilated. - Tricuspid valve: There was severe regurgitation directed   centrally. - Pulmonary arteries: PA peak pressure: 71 mm Hg (S).  SUBJECTIVE: Emergent ETT Line Bronch neg Echo done In and out of fib  VITAL SIGNS: Temp:  [97.3 F (36.3 C)-99.4 F (37.4 C)] 99.4 F (37.4 C) (11/25 0800) Pulse Rate:  [62-77] 74 (11/25 0700) Resp:  [12-26] 26 (11/25 0700) BP: (117-153)/(37-55) 135/41 (11/25 0700) SpO2:  [94 %-100 %] 94 % (11/25 0700) FiO2 (%):  [40 %-100 %] 40 % (11/25 0900) Weight:  [66.6 kg (146 lb 13.2 oz)] 66.6 kg (146 lb 13.2 oz) (11/25 0500)  PHYSICAL EXAMINATION: General: rass -3 not fc Neuro: not fc, perrl HEENT: jvd present, ett PULM: coarse reduced CV:  s1 s2 IRR sys mu neck slight GI: soft, BS wnl, no  r Extremities: 2 plus feet edema   Recent Labs  Lab 10/06/2017 0354 09/14/2017 1627 10/06/17 0500  NA 134* 134* 135  K 4.0 3.9 3.2*  CL 102 103 102  CO2 18* 21* 24  BUN 27* 31* 35*  CREATININE 2.12* 2.19* 2.39*  GLUCOSE 234* 183* 145*   Recent Labs  Lab 10/02/17 0608 09/21/2017 0354 10/06/17 0500  HGB 8.7* 8.8* 7.2*  HCT 26.1* 26.9* 21.6*  WBC 14.0* 22.4* 13.8*  PLT 191 172 105*   Dg Chest 2 View  Result Date: 09/17/2017 CLINICAL DATA:  Shortness of breath and recent pneumonia EXAM: CHEST  2 VIEW COMPARISON:  Chest radiograph 10/02/2017 FINDINGS: There has been marked worsening of confluent bilateral airspace opacities in a patchy distribution involving all regions of the lungs with the exception of mild left apical sparing. Cardiac silhouette is unchanged. No pneumothorax or sizable pleural effusion. IMPRESSION: Severe worsening of confluent airspace disease most consistent with bilateral multifocal pneumonia. Electronically Signed   By: Ulyses Jarred M.D.   On: 09/18/2017 04:49   Dg Abd 1 View  Result Date: 09/19/2017 CLINICAL DATA:  83 year old female with history of orogastric tube placement. EXAM: ABDOMEN - 1 VIEW COMPARISON:  No priors. FINDINGS: Enteric tube extending into the antral pre-pyloric region of the stomach. Visualized bowel gas pattern is unremarkable. Widespread interstitial and airspace disease throughout the lungs bilaterally, most compatible with multilobar pneumonia. IMPRESSION: 1. Tip of enteric tube is in the antral pre-pyloric region of the stomach. Electronically Signed  By: Vinnie Langton M.D.   On: 10/10/2017 18:00   Dg Chest Port 1 View  Result Date: 09/13/2017 CLINICAL DATA:  Intubation.  Central line placement. EXAM: PORTABLE CHEST 1 VIEW COMPARISON:  09/30/2017 at 0425 hours FINDINGS: A new endotracheal tube terminates 3.6 cm above the carina. A new right jugular catheter terminates over the lower SVC. There is progressive obscuration of the  cardiac silhouette. Aortic atherosclerosis is noted. Extensive consolidation throughout both lungs has progressed. No sizable pleural effusion or pneumothorax is identified. IMPRESSION: 1. Satisfactory positioning of endotracheal tube and right jugular catheter. 2. Worsening airspace consolidation throughout both lungs which may reflect pneumonia/ARDS. Electronically Signed   By: Logan Bores M.D.   On: 10/02/2017 13:14    ASSESSMENT / PLAN:  Acute resp failure, favor pulm edema on top of prior infiltrates PNA, rule out extension of pna infectious vs noninfectious Aflutter, fib rvr Daist CHF from above ARF, ATN PULM HTN increased, mild mod AS, diast 2 chf acute Hypokalemia Anemia, no blood loss noted  -was neg 1.4 liters, cvp 16 to 7, tolerate lasix overall, slight reduction -repeat abg, was acidotic and MV was increased -pcxr now and in am  -no SBT, as requires MV higher 12 liters -echo reviewed, doubt PE with Pa pressures way over max 45, at least for acute PE, will doppler ext and dd imer for neg pred value -monitor cvp -k supp, kvo, lasix down slight to q12h -feeding, follow BM -ppi -cbc in am , hope hct rise with lasix -retic, ferr, iron -heparin IV hold for anemia concerns, okay sub q for now -stool occult -cbg -maintain current abx, empiric, follow bronch bal, cell diff unimpressive -rass goal met, prop WUA, fent  I updated daughters in full in room  Ccm time 35 min   Lavon Paganini. Titus Mould, MD, FACP Pgr: [place] [facility]  Pulmonary and [place] Pager: [phone]  10/06/2017, 10:44 AM

## 2017-10-07 ENCOUNTER — Inpatient Hospital Stay (HOSPITAL_COMMUNITY): Payer: Medicare Other

## 2017-10-07 ENCOUNTER — Encounter (HOSPITAL_COMMUNITY): Payer: Self-pay | Admitting: Radiology

## 2017-10-07 ENCOUNTER — Other Ambulatory Visit: Payer: Self-pay

## 2017-10-07 DIAGNOSIS — I5033 Acute on chronic diastolic (congestive) heart failure: Secondary | ICD-10-CM

## 2017-10-07 DIAGNOSIS — R609 Edema, unspecified: Secondary | ICD-10-CM

## 2017-10-07 DIAGNOSIS — J9601 Acute respiratory failure with hypoxia: Secondary | ICD-10-CM

## 2017-10-07 LAB — BLOOD GAS, ARTERIAL
ACID-BASE DEFICIT: 1.9 mmol/L (ref 0.0–2.0)
BICARBONATE: 21.3 mmol/L (ref 20.0–28.0)
DRAWN BY: 11249
FIO2: 50
LHR: 26 {breaths}/min
MECHVT: 400 mL
O2 SAT: 93.6 %
PEEP/CPAP: 5 cmH2O
PH ART: 7.444 (ref 7.350–7.450)
Patient temperature: 98.3
pCO2 arterial: 31.5 mmHg — ABNORMAL LOW (ref 32.0–48.0)
pO2, Arterial: 69.8 mmHg — ABNORMAL LOW (ref 83.0–108.0)

## 2017-10-07 LAB — GLUCOSE, CAPILLARY
GLUCOSE-CAPILLARY: 140 mg/dL — AB (ref 65–99)
GLUCOSE-CAPILLARY: 151 mg/dL — AB (ref 65–99)
Glucose-Capillary: 128 mg/dL — ABNORMAL HIGH (ref 65–99)
Glucose-Capillary: 159 mg/dL — ABNORMAL HIGH (ref 65–99)
Glucose-Capillary: 100 mg/dL — ABNORMAL HIGH (ref 65–99)
Glucose-Capillary: 168 mg/dL — ABNORMAL HIGH (ref 65–99)

## 2017-10-07 LAB — COMPREHENSIVE METABOLIC PANEL
ALK PHOS: 77 U/L (ref 38–126)
ALT: 13 U/L — AB (ref 14–54)
ANION GAP: 8 (ref 5–15)
AST: 27 U/L (ref 15–41)
Albumin: 2.1 g/dL — ABNORMAL LOW (ref 3.5–5.0)
BILIRUBIN TOTAL: 0.7 mg/dL (ref 0.3–1.2)
BUN: 49 mg/dL — ABNORMAL HIGH (ref 6–20)
CALCIUM: 8.4 mg/dL — AB (ref 8.9–10.3)
CO2: 24 mmol/L (ref 22–32)
Chloride: 103 mmol/L (ref 101–111)
Creatinine, Ser: 3 mg/dL — ABNORMAL HIGH (ref 0.44–1.00)
GFR, EST AFRICAN AMERICAN: 15 mL/min — AB (ref >60)
GFR, EST NON AFRICAN AMERICAN: 13 mL/min — AB (ref >60)
Glucose, Bld: 170 mg/dL — ABNORMAL HIGH (ref 65–99)
POTASSIUM: 3.5 mmol/L (ref 3.5–5.1)
SODIUM: 135 mmol/L (ref 135–145)
Total Protein: 5.9 g/dL — ABNORMAL LOW (ref 6.5–8.1)

## 2017-10-07 LAB — CBC WITH DIFFERENTIAL/PLATELET
BASOS ABS: 0 10*3/uL (ref 0.0–0.1)
BASOS PCT: 0 %
Eosinophils Absolute: 0.8 10*3/uL — ABNORMAL HIGH (ref 0.0–0.7)
Eosinophils Relative: 7 %
HEMATOCRIT: 23 % — AB (ref 36.0–46.0)
Hemoglobin: 7.7 g/dL — ABNORMAL LOW (ref 12.0–15.0)
LYMPHS PCT: 12 %
Lymphs Abs: 1.4 10*3/uL (ref 0.7–4.0)
MCH: 30 pg (ref 26.0–34.0)
MCHC: 33.5 g/dL (ref 30.0–36.0)
MCV: 89.5 fL (ref 78.0–100.0)
Monocytes Absolute: 0.9 10*3/uL (ref 0.1–1.0)
Monocytes Relative: 7 %
NEUTROS ABS: 8.9 10*3/uL — AB (ref 1.7–7.7)
Neutrophils Relative %: 74 %
PLATELETS: 96 10*3/uL — AB (ref 150–400)
RBC: 2.57 MIL/uL — AB (ref 3.87–5.11)
RDW: 18.2 % — AB (ref 11.5–15.5)
WBC: 12.1 10*3/uL — AB (ref 4.0–10.5)

## 2017-10-07 LAB — C3 COMPLEMENT: C3 Complement: 135 mg/dL (ref 82–167)

## 2017-10-07 LAB — C4 COMPLEMENT: Complement C4, Body Fluid: 30 mg/dL (ref 14–44)

## 2017-10-07 LAB — MPO/PR-3 (ANCA) ANTIBODIES: ANCA Proteinase 3: <3.5 U/mL (ref 0.0–3.5)

## 2017-10-07 LAB — ANTINUCLEAR ANTIBODIES, IFA: ANTINUCLEAR ANTIBODIES, IFA: NEGATIVE

## 2017-10-07 LAB — OCCULT BLOOD X 1 CARD TO LAB, STOOL: Fecal Occult Bld: NEGATIVE

## 2017-10-07 LAB — PATHOLOGIST SMEAR REVIEW

## 2017-10-07 MED ORDER — DEXTROSE 5 % IV SOLN
1.0000 g | INTRAVENOUS | Status: DC
  Administered 2017-10-07 – 2017-10-09 (×3): 1 g via INTRAVENOUS
  Filled 2017-10-07 (×3): qty 1

## 2017-10-07 MED ORDER — SODIUM CHLORIDE 0.9% FLUSH: 3.0000 mL | INTRAVENOUS | Status: DC | PRN

## 2017-10-07 MED ORDER — DEXMEDETOMIDINE HCL IN NACL 200 MCG/50ML IV SOLN: 0.0000 ug/kg/h | INTRAVENOUS | Status: DC

## 2017-10-07 MED ORDER — SODIUM CHLORIDE 0.9 % IV SOLN: 250.0000 mL | INTRAVENOUS | Status: DC | PRN

## 2017-10-07 MED ORDER — HYDRALAZINE HCL 20 MG/ML IJ SOLN
10.0000 mg | Freq: Four times a day (QID) | INTRAMUSCULAR | Status: DC | PRN
  Administered 2017-10-08 – 2017-10-10 (×6): 10 mg via INTRAVENOUS
  Filled 2017-10-07 (×6): qty 1

## 2017-10-07 MED ORDER — SODIUM CHLORIDE 0.9% FLUSH
3.0000 mL | Freq: Two times a day (BID) | INTRAVENOUS | Status: DC
  Administered 2017-10-07 – 2017-10-11 (×8): 3 mL via INTRAVENOUS

## 2017-10-07 NOTE — Progress Notes (Signed)
Pharmacy Antibiotic Note  Nichole SellsConstance Padilla is a 83 y.o. female admitted on 10/07/2017 with HCAP.   [place] and discharged on augmentin. Pt received vanc and cefepime x 1 in ED. Pharmacy has been consulted for ceftazidime dosing.  Plan: Decrease to Ceftazidime 1gm IV q24h  Follow renal function, cultures and clinical course  Height: 5\' 3"  (160 cm) Weight: 139 lb 1.8 oz (63.1 kg) IBW/kg (Calculated) : 52.4  Temp (24hrs), Avg:98.3 F (36.8 C), Min:97.4 F (36.3 C), Max:99.4 F (37.4 C)  Recent Labs  Lab 10/01/17 0614  10/02/17 0608 09/26/2017 0354 10/07/2017 0617 09/24/2017 1627 09/21/2017 1642 10/06/17 0500 10/06/17 2000 10/07/17 0500  WBC 15.1*  --  14.0* 22.4*  --   --   --  13.8*  --  12.1*  CREATININE 2.47*   < > 2.42* 2.12*  --  2.19*  --  2.39* 2.83* 3.00*  LATICACIDVEN  --   --   --   --  3.56*  --  1.3  --   --   --    < > = values in this interval not displayed.    Estimated Creatinine Clearance: 11.6 mL/min (A) (by C-G formula based on SCr of 3 mg/dL (H)).    No Known Allergies  Antimicrobials this admission: 11/24 vanc x 1 11/24 cefepime x 1 11/24  Ceftazidime >>  Dose adjustments this admission:   Microbiology results: 11/24 BCx: sent 11/24 MRSA PCR: neg  Thank you for allowing pharmacy to be a part of this patient's care.  Nichole Beaverhristine Zoltan Padilla PharmD, BCPS Pager [phone] 10/07/2017 1:45 PM

## 2017-10-07 NOTE — Progress Notes (Signed)
Bilateral lower extremity venous duplex has been completed. Negative for DVT.  10/07/17 9:32 AM Olen CordialGreg Irem [place]ham RVT

## 2017-10-07 NOTE — Progress Notes (Signed)
Name: Nita SellsConstance Hallum MRN: [id number] DOB: January 16, 1927    ADMISSION DATE:  09/16/2017 CONSULTATION DATE:  11.24/18  REFERRING MD :  Dr Benjamine MolaVann  CHIEF COMPLAINT:  Acute resp distress  BRIEF PATIENT DESCRIPTION: Increased wob  83 yr old, concerns chf, fib  SIGNIFICANT EVENTS  11/24 admit, declined, ett, lasix  STUDIES:  ECHO 11/24>>>Left ventricle: The cavity size was normal. Wall thickness was   normal. Systolic function was normal. The estimated ejection   fraction was in the range of 55% to 60%. Features are consistent   with a pseudonormal left ventricular filling pattern, with   concomitant abnormal relaxation and increased filling pressure   (grade 2 diastolic dysfunction). - Aortic valve: Cusp separation was reduced. There was mild to   moderate stenosis. Valve area (VTI): 0.85 cm^2. Valve area   (Vmax): 0.95 cm^2. Valve area (Vmean): 0.94 cm^2. - Mitral valve: Calcified annulus. Mobility of the posterior   leaflet was restricted. There was mild regurgitation. - Left atrium: The atrium was moderately dilated. - Right ventricle: The cavity size was moderately dilated. Wall   thickness was normal. - Right atrium: The atrium was moderately dilated. - Tricuspid valve: There was severe regurgitation directed   centrally. - Pulmonary arteries: PA peak pressure: 71 mm Hg (S).  SUBJECTIVE: Dopplers this am , neg unofficial Urine output good On O2 support  VITAL SIGNS: Temp:  [98.2 F (36.8 C)-99.4 F (37.4 C)] 98.3 F (36.8 C) (11/26 0800) Pulse Rate:  [70-127] 80 (11/26 0600) Resp:  [23-30] 26 (11/26 0600) BP: (113-153)/(41-82) 153/59 (11/26 0600) SpO2:  [88 %-100 %] 97 % (11/26 0813) FiO2 (%):  [40 %-50 %] 40 % (11/26 0813) Weight:  [63.1 kg (139 lb 1.8 oz)] 63.1 kg (139 lb 1.8 oz) (11/26 0600)  PHYSICAL EXAMINATION: General: rass -2 Neuro: not fc, not opening eyes on sedation HEENT: jvd remains PULM: reduced, less coarse CV:  s1 s2 ir mild tachy GI: soft,  bs wnl, no r, having BM per RN Extremities: edema 1     Recent Labs  Lab 10/06/17 0500 10/06/17 2000 10/07/17 0500  NA 135 136 135  K 3.2* 3.3* 3.5  CL 102 103 103  CO2 24 22 24   BUN 35* 44* 49*  CREATININE 2.39* 2.83* 3.00*  GLUCOSE 145* 178* 170*   Recent Labs  Lab 10/03/2017 0354 10/06/17 0500 10/07/17 0500  HGB 8.8* 7.2* 7.7*  HCT 26.9* 21.6* 23.0*  WBC 22.4* 13.8* 12.1*  PLT 172 105* 96*   Dg Abd 1 View  Result Date: 10/06/2017 CLINICAL DATA:  83 year old female with history of orogastric tube placement. EXAM: ABDOMEN - 1 VIEW COMPARISON:  No priors. FINDINGS: Enteric tube extending into the antral pre-pyloric region of the stomach. Visualized bowel gas pattern is unremarkable. Widespread interstitial and airspace disease throughout the lungs bilaterally, most compatible with multilobar pneumonia. IMPRESSION: 1. Tip of enteric tube is in the antral pre-pyloric region of the stomach. Electronically Signed   By: Trudie Reedaniel  Entrikin M.D.   On: 09/13/2017 18:00   Dg Chest Port 1 View  Result Date: 10/07/2017 CLINICAL DATA:  83 year old female with pulmonary edema. Subsequent encounter. EXAM: PORTABLE CHEST 1 VIEW COMPARISON:  10/06/2017. FINDINGS: Endotracheal tube tip 2.2 cm above the carina. Right central line tip distal superior vena cava/ cavoatrial junction. Nasogastric tube courses below the diaphragm. Tip is not included on the present exam. Asymmetric diffuse airspace disease similar to prior exam. This may represent asymmetric pulmonary edema or infectious process. Follow-up until  clearance recommended. Cardiomegaly. Calcified tortuous aorta. No acute osseous abnormality. IMPRESSION: Similar appearance of diffuse asymmetric airspace disease. This may represent pulmonary edema or infectious process. Aortic Atherosclerosis (ICD10-I70.0). Electronically Signed   By: Lacy DuverneySteven  Olson M.D.   On: 10/07/2017 07:25   Dg Chest Port 1 View  Result Date: 10/06/2017 CLINICAL DATA:   Acute respiratory acidosis. EXAM: PORTABLE CHEST 1 VIEW COMPARISON:  Radiograph October 05, 2017. FINDINGS: Stable cardiomegaly. Atherosclerosis of thoracic aorta is noted. Endotracheal and nasogastric tubes are unchanged in position. Stable position of right internal jugular catheter with distal tip at expected position of cavoatrial junction. No pneumothorax is noted. Stable bilateral lung opacities are noted concerning for pneumonia. No significant pleural effusion is noted. Bony thorax is unremarkable. IMPRESSION: Stable support apparatus. Stable bilateral lung opacities concerning for pneumonia. Electronically Signed   By: Lupita RaiderJames  Green Jr, M.D.   On: 10/06/2017 11:20   Dg Chest Port 1 View  Result Date: October 17, 2017 CLINICAL DATA:  Intubation.  Central line placement. EXAM: PORTABLE CHEST 1 VIEW COMPARISON:  October 17, 2017 at 0425 hours FINDINGS: A new endotracheal tube terminates 3.6 cm above the carina. A new right jugular catheter terminates over the lower SVC. There is progressive obscuration of the cardiac silhouette. Aortic atherosclerosis is noted. Extensive consolidation throughout both lungs has progressed. No sizable pleural effusion or pneumothorax is identified. IMPRESSION: 1. Satisfactory positioning of endotracheal tube and right jugular catheter. 2. Worsening airspace consolidation throughout both lungs which may reflect pneumonia/ARDS. Electronically Signed   By: Sebastian AcheAllen  Grady M.D.   On: October 17, 2017 13:14    ASSESSMENT / PLAN:  Acute resp failure, favor pulm edema on top of prior infiltrates PNA, rule out extension of pna infectious vs noninfectious Aflutter, fib rvr Daist CHF from above Mild mod AS, pulm htn ARF, ATN, some rise with lasix PULM HTN increased, mild mod AS, diast 2 chf acute Anemia, no blood loss noted  -ABG reviewed, consider slight rate reduction -Aggressive SBT, cpap 5 ps 5 goal 1 hour, assess rsbi -pcxr is slowly resolving, repeat in am  -would want CT chest  repeat at some stage, if not extubated will perfrom -rate controlled overall, will consult cardiology as I feel a large majority of her resp failure is edema on top from her multile valvular and diastolic dz -hold lasix, with crt rise, cvp 7 -follow crt trend, hold bolus -given crt rise with lasix, need to re assess infiltrates, as we have maxed lasix affect -given nosocomial exposure, will give course ceftaz, pending CT chest assessment -even to pos balance goals slight -some rise hct with lasix, hemoconcentration -plat drop unclear, follow trend, retic is up, consider hold sub q hep, likely had heparin exposure last admission, will assess HITT with this time course -await iron panel, hemoccult testing  -unlikely pe, doppler neg unofficial  I updated daughters in full in room, daily  Ccm time 30 min   Mcarthur Rossettianiel J. Tyson AliasFeinstein, MD, FACP Pgr: [phone] [facility]  Pulmonary and Critical Care Medicine [facility] Pager: [phone]  10/07/2017, 9:30 AM

## 2017-10-07 NOTE — Consult Note (Addendum)
Cardiology Consultation:   Patient ID: Nichole Padilla; [id number]; 12/29/1926   Admit date: 09/12/2017 Date of Consult: 10/07/2017  Primary Care Provider: Patient, No Pcp Per Primary Cardiologist: New to [facility]  Patient Profile:   Nichole Padilla is a 83 y.o. female with a hx recently diagnosed atrial fibrillation, GERD, upper GI bleeding, CKD stage III-IV, HTN and HLD of who is being seen today for the evaluation of CHF and afib at the request of Dr. Feinstein.   Admitted 11/18-11/21 for HCAP. Hospital course complicated by new onset atrial fibrillation. Echo showed normal LVEF. The patient was not started on anticoagalation given recent upper GI bleed (admitted at [place]) and hx of cirrhosis after discussion with PCP by attending.   History of Present Illness:   [name] felt better for few day after last admission. However, return on 11/24 for worsening shortness of breath. Noted hypoxic and in afib RVR. Work up showed severe worsening of b/l airspace disease on x ray. The patient now on PCCM service requiring emergent ETT. Held lasix for worsening renal function. Chest CT today showed features concerning for multilobar pneumonia.   Rate improved on gtt cardizem. Echo this admission showed LVEF of 55-60%, grade 2 DD, mild to moderate AS, mild MR, moderately dilated LA, Pa pressure of 71mm Hg.   D-dimer > 20. No DVT on LE doppler.   Past Medical History:  Diagnosis Date  . Anemia   . Atrial fibrillation (HCC)   . Cirrhosis (HCC) 10/01/2017  . GERD (gastroesophageal reflux disease) 09/30/2017  . Gout   . Hypertension   . Upper GI bleed    Inpatient Medications: Scheduled Meds: . chlorhexidine gluconate (MEDLINE KIT)  15 mL Mouth Rinse BID  . Chlorhexidine Gluconate Cloth  6 each Topical Q0600  . feeding supplement (PRO-STAT SUGAR FREE 64)  30 mL Per Tube Daily  . feeding supplement (VITAL HIGH PROTEIN)  1,000 mL Per Tube Q24H  . insulin aspart  0-5 Units Subcutaneous  QHS  . insulin aspart  0-9 Units Subcutaneous TID WC  . mouth rinse  15 mL Mouth Rinse QID  . pantoprazole (PROTONIX) IV  40 mg Intravenous Q24H  . sodium chloride flush  3 mL Intravenous Q12H   Continuous Infusions: . sodium chloride    . cefTAZidime (FORTAZ)  IV Stopped (10/06/17 2219)  . dexmedetomidine (PRECEDEX) IV infusion    . propofol (DIPRIVAN) infusion 50 mcg/kg/min (10/07/17 1000)   PRN Meds: sodium chloride, acetaminophen **OR** acetaminophen, fentaNYL (SUBLIMAZE) injection, ondansetron **OR** ondansetron (ZOFRAN) IV, sodium chloride flush  Allergies:   No Known Allergies  Social History:   Social History   Socioeconomic History  . Marital status: Widowed    Spouse name: Not on file  . Number of children: Not on file  . Years of education: Not on file  . Highest education level: Not on file  Social Needs  . Financial resource strain: Not on file  . Food insecurity - worry: Not on file  . Food insecurity - inability: Not on file  . Transportation needs - medical: Not on file  . Transportation needs - non-medical: Not on file  Occupational History  . Not on file  Tobacco Use  . Smoking status: Never Smoker  . Smokeless tobacco: Never Used  Substance and Sexual Activity  . Alcohol use: Not on file  . Drug use: Not on file  . Sexual activity: Not on file  Other Topics Concern  . Not on file  Social History Narrative  .   Not on file    Family History:  History reviewed. No pertinent family history.  Unable to get history as patient on V. Support.   ROS:  Please see the history of present illness.  ROS  All other ROS reviewed and negative.     Physical Exam/Data:   Vitals:   10/07/17 1000 10/07/17 1100 10/07/17 1200 10/07/17 1202  BP: (!) 181/43 (!) 151/40 (!) 131/42   Pulse: 73 90 89 97  Resp: (!) 34 (!) 23 (!) 24 (!) 23  Temp:   (!) 97.4 F (36.3 C)   TempSrc:   Oral   SpO2: 97% 92% 97% 97%  Weight:      Height:        Intake/Output Summary  (Last 24 hours) at 10/07/2017 1235 Last data filed at 10/07/2017 1200 Gross per 24 hour  Intake 1501.1 ml  Output 2325 ml  Net -823.9 ml   Filed Weights   09/20/2017 0911 10/06/17 0500 10/07/17 0600  Weight: 139 lb 1.8 oz (63.1 kg) 146 lb 13.2 oz (66.6 kg) 139 lb 1.8 oz (63.1 kg)   Body mass index is 24.64 kg/m.  General:  Ill appearing female on vent support HEENT: normal Lymph: no adenopathy Neck: + JVD Endocrine:  No thryomegaly Vascular: No carotid bruits; FA pulses 2+ bilaterally without bruits  Cardiac:  normal S1, S2; Irregular; no murmur  Lungs:  Diminished breath sound Abd: soft, nontender, no hepatomegaly  Ext: 1+ BL Le edema Musculoskeletal:  No deformities, BUE and BLE strength normal and equal Skin: warm and dry  Neuro:  CNs 2-12 intact, no focal abnormalities noted Psych:  Normal affect   EKG:  The EKG was personally reviewed and demonstrates:  Afib at rate of 120 bpm  Telemetry:  Telemetry was personally reviewed and demonstrates:  Afib/aflutter at controlled rate  Relevant CV Studies: Study Conclusions  - Left ventricle: The cavity size was normal. Wall thickness was   normal. Systolic function was normal. The estimated ejection   fraction was in the range of 55% to 60%. Features are consistent   with a pseudonormal left ventricular filling pattern, with   concomitant abnormal relaxation and increased filling pressure   (grade 2 diastolic dysfunction). - Aortic valve: Cusp separation was reduced. There was mild to   moderate stenosis. Valve area (VTI): 0.85 cm^2. Valve area   (Vmax): 0.95 cm^2. Valve area (Vmean): 0.94 cm^2. - Mitral valve: Calcified annulus. Mobility of the posterior   leaflet was restricted. There was mild regurgitation. - Left atrium: The atrium was moderately dilated. - Right ventricle: The cavity size was moderately dilated. Wall   thickness was normal. - Right atrium: The atrium was moderately dilated. - Tricuspid valve: There was  severe regurgitation directed   centrally. - Pulmonary arteries: PA peak pressure: 71 mm Hg (S).  Laboratory Data:  Chemistry Recent Labs  Lab 10/06/17 0500 10/06/17 2000 10/07/17 0500  NA 135 136 135  K 3.2* 3.3* 3.5  CL 102 103 103  CO2 _0 GLUCOSE 145* 178* 170*  BUN 35* 44* 49*  CREATININE 2.39* 2.83* 3.00*  CALCIUM 8.4* 8.5* 8.4*  GFRNONAA 17* 14* 13*  GFRAA 20* 16* 15*  ANIONGAP _1 Recent Labs  Lab 10/07/17 0500  PROT 5.9*  ALBUMIN 2.1*  AST 27  ALT 13*  ALKPHOS 77  BILITOT 0.7   Hematology Recent Labs  Lab 10/01/2017 0354 10/06/17 0500 10/06/17 1130 10/07/17 0500  WBC 22.4*  13.8*  --  12.1*  RBC 3.03* 2.40* 2.60* 2.57*  HGB 8.8* 7.2*  --  7.7*  HCT 26.9* 21.6*  --  23.0*  MCV 88.8 90.0  --  89.5  MCH 29.0 30.0  --  30.0  MCHC 32.7 33.3  --  33.5  RDW 17.7* 17.7*  --  18.2*  PLT 172 105*  --  96*   Cardiac Enzymes Recent Labs  Lab 09/24/2017 1627 10/06/17 0500  TROPONINI 0.06* 0.05*    Recent Labs  Lab 09/27/2017 0402  TROPIPOC 0.03    BNP Recent Labs  Lab 09/16/2017 0356  BNP 1,248.0*    DDimer  Recent Labs  Lab 10/06/17 1130  DDIMER >20.00*    Radiology/Studies:  Dg Chest 2 View  Result Date: 10/03/2017 CLINICAL DATA:  Shortness of breath and recent pneumonia EXAM: CHEST  2 VIEW COMPARISON:  Chest radiograph 10/02/2017 FINDINGS: There has been marked worsening of confluent bilateral airspace opacities in a patchy distribution involving all regions of the lungs with the exception of mild left apical sparing. Cardiac silhouette is unchanged. No pneumothorax or sizable pleural effusion. IMPRESSION: Severe worsening of confluent airspace disease most consistent with bilateral multifocal pneumonia. Electronically Signed   By: Kevin  Herman M.D.   On: 09/22/2017 04:49   Dg Abd 1 View  Result Date: 09/30/2017 CLINICAL DATA:  89-year-old female with history of orogastric tube placement. EXAM: ABDOMEN - 1 VIEW COMPARISON:  No  priors. FINDINGS: Enteric tube extending into the antral pre-pyloric region of the stomach. Visualized bowel gas pattern is unremarkable. Widespread interstitial and airspace disease throughout the lungs bilaterally, most compatible with multilobar pneumonia. IMPRESSION: 1. Tip of enteric tube is in the antral pre-pyloric region of the stomach. Electronically Signed   By: Daniel  Entrikin M.D.   On: 10/08/2017 18:00   Ct Chest Wo Contrast  Result Date: 10/07/2017 CLINICAL DATA:  Acute respiratory failure. EXAM: CT CHEST WITHOUT CONTRAST TECHNIQUE: Multidetector CT imaging of the chest was performed following the standard protocol without IV contrast. COMPARISON:  CT chest 09/29/2017, chest x-ray 10/07/2017 FINDINGS: Cardiovascular: No significant vascular findings. Normal heart size. No pericardial effusion. Coronary artery atherosclerosis involving the LAD, circumflex and RCA. Thoracic aortic atherosclerosis. Mediastinum/Nodes: Enlarged subcarinal lymph node measuring 17 mm. Thyroid gland, trachea, and esophagus demonstrate no significant findings. Lungs/Pleura: Endotracheal tube with the tip 8 mm above the carina. Small bilateral pleural effusions, right greater than left. Bilateral patchy areas of ground-glass opacity and consolidation involving the upper and lower lobes. Upper Abdomen: No acute upper abdominal abnormality. Musculoskeletal: No acute osseous abnormality. No lytic or sclerotic osseous lesion. Chronic T4, T5 and T12 vertebral body compression fractures. IMPRESSION: 1. Progression of bilateral patchy areas of ground-glass opacity and consolidation involving the upper and lower lobes most concerning for multilobar pneumonia. An element of pulmonary edema is not excluded. 2. Endotracheal tube with the tip 8 mm above the carina. Recommend retracting the endotracheal tube 1-2 cm. 3.  Aortic Atherosclerosis (ICD10-170.0) Electronically Signed   By: Hetal  Patel   On: 10/07/2017 11:42   Dg Chest  Port 1 View  Result Date: 10/07/2017 CLINICAL DATA:  83-year-old female with pulmonary edema. Subsequent encounter. EXAM: PORTABLE CHEST 1 VIEW COMPARISON:  10/06/2017. FINDINGS: Endotracheal tube tip 2.2 cm above the carina. Right central line tip distal superior vena cava/ cavoatrial junction. Nasogastric tube courses below the diaphragm. Tip is not included on the present exam. Asymmetric diffuse airspace disease similar to prior exam. This may represent asymmetric   pulmonary edema or infectious process. Follow-up until clearance recommended. Cardiomegaly. Calcified tortuous aorta. No acute osseous abnormality. IMPRESSION: Similar appearance of diffuse asymmetric airspace disease. This may represent pulmonary edema or infectious process. Aortic Atherosclerosis (ICD10-I70.0). Electronically Signed   By: Genia Del M.D.   On: 10/07/2017 07:25   Dg Chest Port 1 View  Result Date: 10/06/2017 CLINICAL DATA:  Acute respiratory acidosis. EXAM: PORTABLE CHEST 1 VIEW COMPARISON:  Radiograph October 05, 2017. FINDINGS: Stable cardiomegaly. Atherosclerosis of thoracic aorta is noted. Endotracheal and nasogastric tubes are unchanged in position. Stable position of right internal jugular catheter with distal tip at expected position of cavoatrial junction. No pneumothorax is noted. Stable bilateral lung opacities are noted concerning for pneumonia. No significant pleural effusion is noted. Bony thorax is unremarkable. IMPRESSION: Stable support apparatus. Stable bilateral lung opacities concerning for pneumonia. Electronically Signed   By: Marijo Conception, M.D.   On: 10/06/2017 11:20   Dg Chest Port 1 View  Result Date: 09/27/2017 CLINICAL DATA:  Intubation.  Central line placement. EXAM: PORTABLE CHEST 1 VIEW COMPARISON:  09/18/2017 at 0425 hours FINDINGS: A new endotracheal tube terminates 3.6 cm above the carina. A new right jugular catheter terminates over the lower SVC. There is progressive obscuration of  the cardiac silhouette. Aortic atherosclerosis is noted. Extensive consolidation throughout both lungs has progressed. No sizable pleural effusion or pneumothorax is identified. IMPRESSION: 1. Satisfactory positioning of endotracheal tube and right jugular catheter. 2. Worsening airspace consolidation throughout both lungs which may reflect pneumonia/ARDS. Electronically Signed   By: Logan Bores M.D.   On: 10/10/2017 13:14    Assessment and Plan:   1. Persistent atrial fibrillation - Rate controlled. Not on anticoagulation given recent GI bleed. CHADSVASC score of at least 5.   2. Acute diastolic CHF - Echo as above. Off diuretics due to worsening renal function.   For questions or updates, please contact [place] Please consult [email or web address] for contact info under Cardiology/STEMI.   Jarrett Soho, [place]  10/07/2017 12:35 PM   The patient was seen, examined and discussed with Bhagat,Bhavinkumar PA-C and I agree with the above.   83 y.o. female with a hx recently diagnosed atrial fibrillation, GERD, upper GI bleeding, CKD stage III-IV, HTN and HLD of who is being seen today for the evaluation of CHF and afib. Admitted 11/18-11/21 for HCAP. Hospital course complicated by new onset atrial fibrillation. Echo showed normal LVEF, grade 2 diastolic dysfunction. Mild to moderate aortic stenosis, mild MR, severe TR and severe pulmonary hypertension.  D-dimer > 20. No DVT on LE doppler.  The patient was not started on anticoagalation given recent upper GI bleed (admitted at [facility]) and hx of cirrhosis after discussion with PCP by attending. She was readmitted on 11/24 for worsening shortness of breath, CT chest today shows multilobar pneumonia, mild CHF. She has been receiving iv Lasix yesterday with worsening Crea 2.1--> 3.0, Lasix was held today.  The patient developed acute respiratory failure and was intubated, on iv ATB.   I agree with holding lasix, I would follow closely,  she is at her baseline weight 139 lbs, nephrology input would be appreciated.  Her a-fib is rate controlled, I agree with no anticoagulation as she is severely anemic. Keep Hb > 8 g/dL.  Give hydralazine 10 mg iv Q6H PRN for hypertension > 140 mmHg.   Ena Dawley, MD 10/07/2017

## 2017-10-08 ENCOUNTER — Inpatient Hospital Stay (HOSPITAL_COMMUNITY): Payer: Medicare Other

## 2017-10-08 DIAGNOSIS — N170 Acute kidney failure with tubular necrosis: Secondary | ICD-10-CM

## 2017-10-08 DIAGNOSIS — N184 Chronic kidney disease, stage 4 (severe): Secondary | ICD-10-CM

## 2017-10-08 LAB — ANCA TITERS: C-ANCA: <1:20 {titer}

## 2017-10-08 LAB — CULTURE, BAL-QUANTITATIVE

## 2017-10-08 LAB — BASIC METABOLIC PANEL
ANION GAP: 14 (ref 5–15)
Anion gap: 12 (ref 5–15)
BUN: 64 mg/dL — AB (ref 6–20)
BUN: 64 mg/dL — AB (ref 6–20)
CALCIUM: 8.3 mg/dL — AB (ref 8.9–10.3)
CHLORIDE: 101 mmol/L (ref 101–111)
CO2: 23 mmol/L (ref 22–32)
CO2: 21 mmol/L — ABNORMAL LOW (ref 22–32)
CREATININE: 3.25 mg/dL — AB (ref 0.44–1.00)
Calcium: 8.4 mg/dL — ABNORMAL LOW (ref 8.9–10.3)
Chloride: 101 mmol/L (ref 101–111)
Creatinine, Ser: 3.27 mg/dL — ABNORMAL HIGH (ref 0.44–1.00)
GFR calc Af Amer: 14 mL/min — ABNORMAL LOW (ref >60)
GFR, EST AFRICAN AMERICAN: 14 mL/min — AB (ref >60)
GFR, EST NON AFRICAN AMERICAN: 12 mL/min — AB (ref >60)
GFR, EST NON AFRICAN AMERICAN: 12 mL/min — AB (ref >60)
GLUCOSE: 195 mg/dL — AB (ref 65–99)
Glucose, Bld: 194 mg/dL — ABNORMAL HIGH (ref 65–99)
POTASSIUM: 3.4 mmol/L — AB (ref 3.5–5.1)
Potassium: 2.7 mmol/L — CL (ref 3.5–5.1)
SODIUM: 136 mmol/L (ref 135–145)
SODIUM: 136 mmol/L (ref 135–145)

## 2017-10-08 LAB — GLUCOSE, CAPILLARY
GLUCOSE-CAPILLARY: 198 mg/dL — AB (ref 65–99)
GLUCOSE-CAPILLARY: 210 mg/dL — AB (ref 65–99)
GLUCOSE-CAPILLARY: 154 mg/dL — AB (ref 65–99)
Glucose-Capillary: 171 mg/dL — ABNORMAL HIGH (ref 65–99)
Glucose-Capillary: 186 mg/dL — ABNORMAL HIGH (ref 65–99)
Glucose-Capillary: 186 mg/dL — ABNORMAL HIGH (ref 65–99)
Glucose-Capillary: 210 mg/dL — ABNORMAL HIGH (ref 65–99)

## 2017-10-08 LAB — CBC WITH DIFFERENTIAL/PLATELET
BASOS ABS: 0 10*3/uL (ref 0.0–0.1)
BASOS PCT: 0 %
EOS ABS: 0.7 10*3/uL (ref 0.0–0.7)
Eosinophils Relative: 5 %
HEMATOCRIT: 24.5 % — AB (ref 36.0–46.0)
HEMOGLOBIN: 8.4 g/dL — AB (ref 12.0–15.0)
Lymphocytes Relative: 7 %
Lymphs Abs: 1 10*3/uL (ref 0.7–4.0)
MCH: 30.8 pg (ref 26.0–34.0)
MCHC: 34.3 g/dL (ref 30.0–36.0)
MCV: 89.7 fL (ref 78.0–100.0)
MONO ABS: 0.8 10*3/uL (ref 0.1–1.0)
Monocytes Relative: 6 %
Neutro Abs: 12.1 10*3/uL — ABNORMAL HIGH (ref 1.7–7.7)
Neutrophils Relative %: 82 %
Platelets: 108 10*3/uL — ABNORMAL LOW (ref 150–400)
RBC: 2.73 MIL/uL — ABNORMAL LOW (ref 3.87–5.11)
RDW: 18.9 % — AB (ref 11.5–15.5)
WBC: 14.7 10*3/uL — AB (ref 4.0–10.5)

## 2017-10-08 LAB — CULTURE, BAL-QUANTITATIVE W GRAM STAIN

## 2017-10-08 LAB — SEDIMENTATION RATE: Sed Rate: 104 mm/hr — ABNORMAL HIGH (ref 0–22)

## 2017-10-08 LAB — TRIGLYCERIDES: TRIGLYCERIDES: 207 mg/dL — AB (ref <150)

## 2017-10-08 LAB — C-REACTIVE PROTEIN: CRP: 13.7 mg/dL — AB (ref <1.0)

## 2017-10-08 LAB — HEPARIN INDUCED PLATELET AB (HIT ANTIBODY): HEPARIN INDUCED PLT AB: 0.253 {OD_unit} (ref 0.000–0.400)

## 2017-10-08 MED ORDER — VITAL HIGH PROTEIN PO LIQD
1000.0000 mL | ORAL | Status: DC
  Administered 2017-10-08: 1000 mL
  Filled 2017-10-08 (×2): qty 1000

## 2017-10-08 MED ORDER — METOPROLOL TARTRATE 25 MG PO TABS
50.0000 mg | ORAL_TABLET | Freq: Two times a day (BID) | ORAL | Status: DC
  Administered 2017-10-08 – 2017-10-09 (×2): 50 mg via ORAL
  Filled 2017-10-08 (×3): qty 2

## 2017-10-08 MED ORDER — POTASSIUM CHLORIDE 10 MEQ/100ML IV SOLN
10.0000 meq | INTRAVENOUS | Status: AC
  Administered 2017-10-08 (×2): 10 meq via INTRAVENOUS
  Filled 2017-10-08 (×2): qty 100

## 2017-10-08 MED ORDER — POTASSIUM CHLORIDE CRYS ER 20 MEQ PO TBCR
40.0000 meq | EXTENDED_RELEASE_TABLET | Freq: Once | ORAL | Status: AC
  Administered 2017-10-08: 40 meq via ORAL
  Filled 2017-10-08: qty 2

## 2017-10-08 MED ORDER — METOPROLOL TARTRATE 25 MG PO TABS
25.0000 mg | ORAL_TABLET | Freq: Two times a day (BID) | ORAL | Status: DC
  Administered 2017-10-08: 25 mg via ORAL
  Filled 2017-10-08 (×2): qty 1

## 2017-10-08 MED ORDER — POTASSIUM CHLORIDE 10 MEQ/100ML IV SOLN
10.0000 meq | INTRAVENOUS | Status: AC
  Administered 2017-10-08 (×3): 10 meq via INTRAVENOUS
  Filled 2017-10-08 (×3): qty 100

## 2017-10-08 NOTE — Progress Notes (Signed)
Nutrition Follow-up  DOCUMENTATION CODES:   Not applicable  INTERVENTION:  - Will adjust TF regimen: Vital High Protein @ 40 mL/hr and d/c Prostat. This regimen + kcal from current Propofol rate will provide 1261 kcal (94% estimated kcal need), 84 grams of protein, and 802 mL free water. - Will monitor for changes to Propofol rate.   NUTRITION DIAGNOSIS:   Inadequate oral intake related to inability to eat as evidenced by NPO status. -ongoing  GOAL:   Patient will meet greater than or equal to 90% of their needs -met with TF regimen  MONITOR:   Labs, Weight trends, Vent status, TF tolerance, I & O's  ASSESSMENT:    84 y.o. female with medical history significant of anemia due to suspected UGI bleed at [facility], recent hospitalization for PNA and discharged on augmentin.  Per family patient is very active at baseline.  She was discharged on Wednesday and did well until Friday when she became very weak and had worsening SOB.  Patient was found to be in a fib during last hospitalization, unsure of time frame for this-- appears new but per PCP: patient with a history of peptic ulcer disease as well as cirrhosis and not a good anticoagulation candidate.    11/27 Pt remains intubated with OGT in place. She is receiving Vital High Protein @ 35 mL/hr with 30 mL Prostat once/day. This regimen is providing 940 kcal, 88 grams of protein, and 702 mL free water. Estimated nutrition needs updated at this time and based on current weight of 60.8 kg as weight -2.3 kg since admission. Noted hypokalemia and K trend since admission. Do not feel that this is related to TF or indication of refeeding given TF regimen unchanged and Mg and Phos have been WDL/Phos elevated on admission and on 11/25.   Patient is currently intubated on ventilator support MV: 12 L/min Temp (24hrs), Avg:98.2 F (36.8 C), Min:97.4 F (36.3 C), Max:99 F (37.2 C) Propofol: 11.4 ml/hr (301 kcal)  BP: 147/42 and MAP:  83  Medications reviewed; sliding scale Novolog, 40 mg IV Protonix/day, 40 mEq KCl per OGT x1 dose today. Labs reviewed; CBGs: 198 and 186 mg/dL this AM, K: 2.7 mmol/L, BUN: 64 mg/dL, creatinine: 3.25 mg/dL, Ca: 8.3 mg/dL, GFR: 14 mL/min.   Drip: Propofol @ 30 mcg/kg/min.    11/25 - Patient just discharged from [facility] on 11/21.  - Since that admission, intakes and appetite did not improve.  - Pt emergently intubated 11/24.  - Prior to previous admission, pt was c/o poor appetite.  Patient is currently intubated on ventilator support MV: 9.1 L/min Temp (24hrs), Avg:97.8 F (36.6 C), Min:97.3 F (36.3 C), Max:99.4 F (37.4 C) Propofol: 18.9 ml/hr -provides 498 kcal       Diet Order:  Diet NPO time specified Except for: Sips with Meds  EDUCATION NEEDS:   No education needs have been identified at this time  Skin:  Skin Assessment: Reviewed RN Assessment  Last BM:  11/27  Height:   Ht Readings from Last 1 Encounters:  10/07/17 '5\' 3"'$  (1.6 m)    Weight:   Wt Readings from Last 1 Encounters:  10/08/17 134 lb 0.6 oz (60.8 kg)    Ideal Body Weight:  52.3 kg  BMI:  Body mass index is 23.74 kg/m.  Estimated Nutritional Needs:   Kcal:  1345  Protein:  73-91 grams (1.2-1.5 grams/kg)  Fluid:  1.5L/day     Jarome Matin, MS, RD, LDN, CNSC Inpatient Clinical Dietitian  Pager # [phone] After hours/weekend pager # [phone]  

## 2017-10-08 NOTE — Progress Notes (Signed)
Date: October 08, 2017 Avana Kreiser, BSN, RN3, CCM  [phone] Chart and notes review for patient progress and needs. Will follow for case management and discharge needs. Next review date: [id number] 

## 2017-10-08 NOTE — Progress Notes (Signed)
Progress Note  Patient Name: Demesha Boorman Date of Encounter: 10/08/2017  Primary Cardiologist: New to Dr. Meda Coffee   Subjective   Intubated, sedated.  Inpatient Medications    Scheduled Meds: . chlorhexidine gluconate (MEDLINE KIT)  15 mL Mouth Rinse BID  . Chlorhexidine Gluconate Cloth  6 each Topical Q0600  . feeding supplement (VITAL HIGH PROTEIN)  1,000 mL Per Tube Q24H  . insulin aspart  0-5 Units Subcutaneous QHS  . insulin aspart  0-9 Units Subcutaneous TID WC  . mouth rinse  15 mL Mouth Rinse QID  . metoprolol tartrate  25 mg Oral BID  . pantoprazole (PROTONIX) IV  40 mg Intravenous Q24H  . sodium chloride flush  3 mL Intravenous Q12H   Continuous Infusions: . sodium chloride 250 mL (10/08/17 0600)  . cefTAZidime (FORTAZ)  IV Stopped (10/07/17 2310)  . potassium chloride 10 mEq (10/08/17 1249)  . propofol (DIPRIVAN) infusion 30 mcg/kg/min (10/08/17 1146)   PRN Meds: sodium chloride, acetaminophen **OR** acetaminophen, fentaNYL (SUBLIMAZE) injection, hydrALAZINE, ondansetron **OR** ondansetron (ZOFRAN) IV, sodium chloride flush   Vital Signs    Vitals:   10/08/17 1029 10/08/17 1140 10/08/17 1200 10/08/17 1205  BP:   (!) 168/43   Pulse:   99   Resp:   (!) 23   Temp:  98.4 F (36.9 C)    TempSrc:  Axillary    SpO2: 91%  92% 91%  Weight:      Height:        Intake/Output Summary (Last 24 hours) at 10/08/2017 1323 Last data filed at 10/08/2017 0600 Gross per 24 hour  Intake 871.3 ml  Output 2250 ml  Net -1378.7 ml   Filed Weights   10/06/17 0500 10/07/17 0600 10/08/17 0918  Weight: 146 lb 13.2 oz (66.6 kg) 139 lb 1.8 oz (63.1 kg) 134 lb 0.6 oz (60.8 kg)    Telemetry    afib at rate of 80-90s - Personally Reviewed  ECG    N/A  Physical Exam   GEN: ill appearing female on support, No acute distress.   Neck: No JVD Cardiac: RRR, no murmurs, rubs, or gallops.  Respiratory: Course breath sound bilaterally. GI: Soft, nontender,  non-distended  MS: No edema; No deformity. Neuro:  Nonfocal  Psych: Normal affect   Labs    Chemistry Recent Labs  Lab 10/06/17 2000 10/07/17 0500 10/08/17 0500  NA 136 135 136  K 3.3* 3.5 2.7*  CL 103 103 101  CO2 22 24 23   GLUCOSE 178* 170* 195*  BUN 44* 49* 64*  CREATININE 2.83* 3.00* 3.25*  CALCIUM 8.5* 8.4* 8.3*  PROT  --  5.9*  --   ALBUMIN  --  2.1*  --   AST  --  27  --   ALT  --  13*  --   ALKPHOS  --  77  --   BILITOT  --  0.7  --   GFRNONAA 14* 13* 12*  GFRAA 16* 15* 14*  ANIONGAP 11 8 12      Hematology Recent Labs  Lab 10/06/17 0500 10/06/17 1130 10/07/17 0500 10/08/17 0500  WBC 13.8*  --  12.1* 14.7*  RBC 2.40* 2.60* 2.57* 2.73*  HGB 7.2*  --  7.7* 8.4*  HCT 21.6*  --  23.0* 24.5*  MCV 90.0  --  89.5 89.7  MCH 30.0  --  30.0 30.8  MCHC 33.3  --  33.5 34.3  RDW 17.7*  --  18.2* 18.9*  PLT 105*  --  96* 108*    Cardiac Enzymes Recent Labs  Lab 10/09/2017 1627 10/06/17 0500  TROPONINI 0.06* 0.05*    Recent Labs  Lab 10/06/2017 0402  TROPIPOC 0.03     BNP Recent Labs  Lab 09/20/2017 0356  BNP 1,248.0*     DDimer  Recent Labs  Lab 10/06/17 1130  DDIMER >20.00*     Radiology    Ct Chest Wo Contrast  Result Date: 10/07/2017 CLINICAL DATA:  Acute respiratory failure. EXAM: CT CHEST WITHOUT CONTRAST TECHNIQUE: Multidetector CT imaging of the chest was performed following the standard protocol without IV contrast. COMPARISON:  CT chest 09/29/2017, chest x-ray 10/07/2017 FINDINGS: Cardiovascular: No significant vascular findings. Normal heart size. No pericardial effusion. Coronary artery atherosclerosis involving the LAD, circumflex and RCA. Thoracic aortic atherosclerosis. Mediastinum/Nodes: Enlarged subcarinal lymph node measuring 17 mm. Thyroid gland, trachea, and esophagus demonstrate no significant findings. Lungs/Pleura: Endotracheal tube with the tip 8 mm above the carina. Small bilateral pleural effusions, right greater than left.  Bilateral patchy areas of ground-glass opacity and consolidation involving the upper and lower lobes. Upper Abdomen: No acute upper abdominal abnormality. Musculoskeletal: No acute osseous abnormality. No lytic or sclerotic osseous lesion. Chronic T4, T5 and T12 vertebral body compression fractures. IMPRESSION: 1. Progression of bilateral patchy areas of ground-glass opacity and consolidation involving the upper and lower lobes most concerning for multilobar pneumonia. An element of pulmonary edema is not excluded. 2. Endotracheal tube with the tip 8 mm above the carina. Recommend retracting the endotracheal tube 1-2 cm. 3.  Aortic Atherosclerosis (ICD10-170.0) Electronically Signed   By: Kathreen Devoid   On: 10/07/2017 11:42   Dg Chest Port 1 View  Result Date: 10/08/2017 CLINICAL DATA:  Acute respiratory acidosis EXAM: PORTABLE CHEST 1 VIEW COMPARISON:  10/07/2017 FINDINGS: Endotracheal tube 2.5 cm above the carina unchanged in position. Central venous catheter tip in the cavoatrial junction unchanged. Gastric tube enters the stomach. Diffuse bilateral airspace disease with mild interval improvement. Minimal left effusion. No pneumothorax IMPRESSION: Diffuse bilateral airspace disease with mild interval improvement. Electronically Signed   By: Franchot Gallo M.D.   On: 10/08/2017 06:46   Dg Chest Port 1 View  Result Date: 10/07/2017 CLINICAL DATA:  83 year old female with pulmonary edema. Subsequent encounter. EXAM: PORTABLE CHEST 1 VIEW COMPARISON:  10/06/2017. FINDINGS: Endotracheal tube tip 2.2 cm above the carina. Right central line tip distal superior vena cava/ cavoatrial junction. Nasogastric tube courses below the diaphragm. Tip is not included on the present exam. Asymmetric diffuse airspace disease similar to prior exam. This may represent asymmetric pulmonary edema or infectious process. Follow-up until clearance recommended. Cardiomegaly. Calcified tortuous aorta. No acute osseous abnormality.  IMPRESSION: Similar appearance of diffuse asymmetric airspace disease. This may represent pulmonary edema or infectious process. Aortic Atherosclerosis (ICD10-I70.0). Electronically Signed   By: Genia Del M.D.   On: 10/07/2017 07:25    Cardiac Studies   Study Conclusions  - Left ventricle: The cavity size was normal. Wall thickness was normal. Systolic function was normal. The estimated ejection fraction was in the range of 55% to 60%. Features are consistent with a pseudonormal left ventricular filling pattern, with concomitant abnormal relaxation and increased filling pressure (grade 2 diastolic dysfunction). - Aortic valve: Cusp separation was reduced. There was mild to moderate stenosis. Valve area (VTI): 0.85 cm^2. Valve area (Vmax): 0.95 cm^2. Valve area (Vmean): 0.94 cm^2. - Mitral valve: Calcified annulus. Mobility of the posterior leaflet was restricted. There was mild regurgitation. - Left atrium: The atrium  was moderately dilated. - Right ventricle: The cavity size was moderately dilated. Wall thickness was normal. - Right atrium: The atrium was moderately dilated. - Tricuspid valve: There was severe regurgitation directed centrally. - Pulmonary arteries: PA peak pressure: 71 mm Hg (S).    Patient Profile   Mccall Will is a 83 y.o. female with a hx recently diagnosed atrial fibrillation, GERD, upper GI bleeding, CKD stage III-IV, HTN and HLD of who is admitted 11/24 for acute hypoxic respiratory failure with PNA requiring emergent intubation.   Admitted 11/18-11/21 for HCAP. Hospital course complicated by new onset atrial fibrillation. Echo showed normal LVEF. The patient was not started on anticoagalation given recent upper GI bleed (admitted at [facility]) and hx of cirrhosis after discussion with PCP by attending.   Assessment & Plan    1. Persistent atrial fibrillation - Rate controlled. Not on anticoagulation given recent GI bleed.  CHADSVASC score of at least 5. Hgb stable.   2. Acute diastolic CHF - Echo as above. Diuretics on hold. Volume stable stable.   3. Acute on chronic kidney diease, stage III-IV - Scr is worsening despite holding diuretics. Consider nephrology consult.   4. HTN - On BB. PRN hydralazine.  For questions or updates, please contact [place] Please consult [email or web address] for contact info under Cardiology/STEMI.     SignedCrista Luria [place], PA  10/08/2017, 1:23 PM     The patient was seen, examined and discussed with Bhagat,Bhavinkumar PA-C and I agree with the above.   83 y.o. female with a hx recently diagnosed atrial fibrillation, GERD, upper GI bleeding, CKD stage III-IV, HTN and HLD of who is being seen today for the evaluation of CHF and afib. Admitted 11/18-11/21 for HCAP. Hospital course complicated by new onset atrial fibrillation. Echo showed normal LVEF, grade 2 diastolic dysfunction. Mild to moderate aortic stenosis, mild MR, severe TR and severe pulmonary hypertension.  D-dimer > 20. No DVT on LE doppler.  The patient was not started on anticoagulation given recent upper GI bleed (admitted at [facility]) and hx of cirrhosis after discussion with PCP by attending. She was readmitted on 11/24 for worsening shortness of breath, CT chest today shows multilobar pneumonia, mild CHF. The patient developed acute respiratory failure and was intubated, on iv ATB She has been receiving iv Lasix with good diuresis but worsening Crea 2.1--> 3.0-->3.25, Lasix was held yesterday and today.   Continue to hold lasix, she remains in a-fib intermittently in RVR, HRs in 80'-100'.  She is hypertensive, I will increase metoprolol to 50 mg po BID.  Keep Hb > 8 g/dL.  Give hydralazine 10 mg iv Q6H PRN for hypertension > 140 mmHg.   Ena Dawley, MD 10/08/2017

## 2017-10-08 NOTE — Progress Notes (Addendum)
Name: Nichole Padilla MRN: [id number] DOB: Apr 30, 1927    ADMISSION DATE:  09/24/2017 CONSULTATION DATE:  11.24/18  REFERRING MD :  Dr Eliseo Squires  CHIEF COMPLAINT:  Acute resp distress  BRIEF PATIENT DESCRIPTION: Increased wob  83 yr old, concerns chf, fib  SIGNIFICANT EVENTS  11/24 admit, declined, ett, lasix  STUDIES:  ECHO 11/24>>>Left ventricle: The cavity size was normal. Wall thickness was   normal. Systolic function was normal. The estimated ejection   fraction was in the range of 55% to 60%. Features are consistent   with a pseudonormal left ventricular filling pattern, with   concomitant abnormal relaxation and increased filling pressure   (grade 2 diastolic dysfunction). - Aortic valve: Cusp separation was reduced. There was mild to   moderate stenosis. Valve area (VTI): 0.85 cm^2. Valve area   (Vmax): 0.95 cm^2. Valve area (Vmean): 0.94 cm^2. - Mitral valve: Calcified annulus. Mobility of the posterior   leaflet was restricted. There was mild regurgitation. - Left atrium: The atrium was moderately dilated. - Right ventricle: The cavity size was moderately dilated. Wall   thickness was normal. - Right atrium: The atrium was moderately dilated. - Tricuspid valve: There was severe regurgitation directed   centrally. - Pulmonary arteries: PA peak pressure: 71 mm Hg (S).  CT chest 11/26>>>ground galss, rt small effusion, progression since last  SUBJECTIVE: Ct chest done Echo done Neg 1.5 liters  VITAL SIGNS: Temp:  [97.4 F (36.3 C)-99 F (37.2 C)] 98 F (36.7 C) (11/27 0720) Pulse Rate:  [74-97] 82 (11/27 0900) Resp:  [19-43] 28 (11/27 0900) BP: (118-168)/(40-75) 162/75 (11/27 0900) SpO2:  [92 %-100 %] 95 % (11/27 0900) FiO2 (%):  [30 %-40 %] 30 % (11/27 0918) Weight:  [60.8 kg (134 lb 0.6 oz)] 60.8 kg (134 lb 0.6 oz) (11/27 0918)  PHYSICAL EXAMINATION: General: rass -1 Neuro: rass -1, not fc, agitation on WUA HEENT: jvd remains PULM: coarse BS CV: s1  s2 irr GI: soft, BS wnl, bs, no r Extremities: edema none legs     Recent Labs  Lab 10/06/17 2000 10/07/17 0500 10/08/17 0500  NA 136 135 136  K 3.3* 3.5 2.7*  CL 103 103 101  CO2 22 24 23   BUN 44* 49* 64*  CREATININE 2.83* 3.00* 3.25*  GLUCOSE 178* 170* 195*   Recent Labs  Lab 10/06/17 0500 10/07/17 0500 10/08/17 0500  HGB 7.2* 7.7* 8.4*  HCT 21.6* 23.0* 24.5*  WBC 13.8* 12.1* 14.7*  PLT 105* 96* 108*   Ct Chest Wo Contrast  Result Date: 10/07/2017 CLINICAL DATA:  Acute respiratory failure. EXAM: CT CHEST WITHOUT CONTRAST TECHNIQUE: Multidetector CT imaging of the chest was performed following the standard protocol without IV contrast. COMPARISON:  CT chest 09/29/2017, chest x-ray 10/07/2017 FINDINGS: Cardiovascular: No significant vascular findings. Normal heart size. No pericardial effusion. Coronary artery atherosclerosis involving the LAD, circumflex and RCA. Thoracic aortic atherosclerosis. Mediastinum/Nodes: Enlarged subcarinal lymph node measuring 17 mm. Thyroid gland, trachea, and esophagus demonstrate no significant findings. Lungs/Pleura: Endotracheal tube with the tip 8 mm above the carina. Small bilateral pleural effusions, right greater than left. Bilateral patchy areas of ground-glass opacity and consolidation involving the upper and lower lobes. Upper Abdomen: No acute upper abdominal abnormality. Musculoskeletal: No acute osseous abnormality. No lytic or sclerotic osseous lesion. Chronic T4, T5 and T12 vertebral body compression fractures. IMPRESSION: 1. Progression of bilateral patchy areas of ground-glass opacity and consolidation involving the upper and lower lobes most concerning for multilobar pneumonia. An element  of pulmonary edema is not excluded. 2. Endotracheal tube with the tip 8 mm above the carina. Recommend retracting the endotracheal tube 1-2 cm. 3.  Aortic Atherosclerosis (ICD10-170.0) Electronically Signed   By: Kathreen Devoid   On: 10/07/2017 11:42    Dg Chest Port 1 View  Result Date: 10/08/2017 CLINICAL DATA:  Acute respiratory acidosis EXAM: PORTABLE CHEST 1 VIEW COMPARISON:  10/07/2017 FINDINGS: Endotracheal tube 2.5 cm above the carina unchanged in position. Central venous catheter tip in the cavoatrial junction unchanged. Gastric tube enters the stomach. Diffuse bilateral airspace disease with mild interval improvement. Minimal left effusion. No pneumothorax IMPRESSION: Diffuse bilateral airspace disease with mild interval improvement. Electronically Signed   By: Franchot Gallo M.D.   On: 10/08/2017 06:46   Dg Chest Port 1 View  Result Date: 10/07/2017 CLINICAL DATA:  83 year old female with pulmonary edema. Subsequent encounter. EXAM: PORTABLE CHEST 1 VIEW COMPARISON:  10/06/2017. FINDINGS: Endotracheal tube tip 2.2 cm above the carina. Right central line tip distal superior vena cava/ cavoatrial junction. Nasogastric tube courses below the diaphragm. Tip is not included on the present exam. Asymmetric diffuse airspace disease similar to prior exam. This may represent asymmetric pulmonary edema or infectious process. Follow-up until clearance recommended. Cardiomegaly. Calcified tortuous aorta. No acute osseous abnormality. IMPRESSION: Similar appearance of diffuse asymmetric airspace disease. This may represent pulmonary edema or infectious process. Aortic Atherosclerosis (ICD10-I70.0). Electronically Signed   By: Genia Del M.D.   On: 10/07/2017 07:25   Dg Chest Port 1 View  Result Date: 10/06/2017 CLINICAL DATA:  Acute respiratory acidosis. EXAM: PORTABLE CHEST 1 VIEW COMPARISON:  Radiograph October 05, 2017. FINDINGS: Stable cardiomegaly. Atherosclerosis of thoracic aorta is noted. Endotracheal and nasogastric tubes are unchanged in position. Stable position of right internal jugular catheter with distal tip at expected position of cavoatrial junction. No pneumothorax is noted. Stable bilateral lung opacities are noted concerning  for pneumonia. No significant pleural effusion is noted. Bony thorax is unremarkable. IMPRESSION: Stable support apparatus. Stable bilateral lung opacities concerning for pneumonia. Electronically Signed   By: Marijo Conception, M.D.   On: 10/06/2017 11:20    ASSESSMENT / PLAN:  Acute resp failure, favor pulm edema on top of prior infiltrates PNA, rule out extension of pna infectious vs noninfectious Aflutter, fib rvr Daist CHF from above Mild mod AS, pulm htn ARF, ATN, some rise with lasix PULM HTN increased, mild mod AS, diast 2 chf acute Anemia, no blood loss noted Unlikely E coli or candida is a pathogen  -repeat CT chest I reviewed, this is not clear to me to be infectious, may require empiric steroids -anca, c3, c4 neg, will assess esr, crp, BOOP? -pcxr slight better aeration with lasix left over diuresis, crt is plat, hold lasix further - wean PS 12 required, reduce prop as able to allow improved PS needs -pcxr in am  -I do not believe e coli or candida is a pathogen -no role thora on rt -zardiology recs hydral, started, control afterload If esr, crp elevated and remains culture neg essentially will consider steroids 40 q12h solumedrol -feeding is successful -BB metop tube -holding home norvasc as wil not control HR well -continued to control HR D dimer elavted, may be from critical illness, we have infiltrates and edema reasons for resp failure Doppler neg also, with low clinical suspicion -holding heparin with anemia, recent GI bleed -ceftaz, will complete 8 days, empiric nosocomial -glu is controlled  Ccm time 30 min   Lavon Paganini. Titus Mould,  MD, FACP Pgr: [phone] [facility]  Pulmonary and [place] Pager: [phone]  10/08/2017, 10:13 AM   I have had extensive discussions with family daughter . We discussed patients current circumstances and organ failures. We also discussed patient's prior wishes under  circumstances such as this. Family has decided to NOT perform resuscitation if arrest but to continue current medical support for now.

## 2017-10-08 NOTE — Progress Notes (Signed)
eLink Physician-Brief Progress Note Patient Name: Nichole SellsConstance Padilla DOB: 26-May-1927 MRN: [id number]   Date of Service  10/08/2017  HPI/Events of Note  Hypokalemia  eICU Interventions  Potassium replaced     Intervention Category Intermediate Interventions: Electrolyte abnormality - evaluation and management  Shelonda Saxe 10/08/2017, 8:30 PM

## 2017-10-09 ENCOUNTER — Inpatient Hospital Stay (HOSPITAL_COMMUNITY): Payer: Medicare Other

## 2017-10-09 LAB — BASIC METABOLIC PANEL
ANION GAP: 12 (ref 5–15)
BUN: 73 mg/dL — AB (ref 6–20)
CHLORIDE: 103 mmol/L (ref 101–111)
CO2: 21 mmol/L — AB (ref 22–32)
Calcium: 8.5 mg/dL — ABNORMAL LOW (ref 8.9–10.3)
Creatinine, Ser: 3.32 mg/dL — ABNORMAL HIGH (ref 0.44–1.00)
GFR calc Af Amer: 13 mL/min — ABNORMAL LOW (ref >60)
GFR calc non Af Amer: 11 mL/min — ABNORMAL LOW (ref >60)
GLUCOSE: 194 mg/dL — AB (ref 65–99)
POTASSIUM: 3.5 mmol/L (ref 3.5–5.1)
Sodium: 136 mmol/L (ref 135–145)

## 2017-10-09 LAB — PHOSPHORUS: Phosphorus: 6 mg/dL — ABNORMAL HIGH (ref 2.5–4.6)

## 2017-10-09 LAB — GLUCOSE, CAPILLARY
GLUCOSE-CAPILLARY: 191 mg/dL — AB (ref 65–99)
GLUCOSE-CAPILLARY: 205 mg/dL — AB (ref 65–99)
GLUCOSE-CAPILLARY: 209 mg/dL — AB (ref 65–99)
GLUCOSE-CAPILLARY: 203 mg/dL — AB (ref 65–99)
GLUCOSE-CAPILLARY: 214 mg/dL — AB (ref 65–99)
Glucose-Capillary: 179 mg/dL — ABNORMAL HIGH (ref 65–99)
Glucose-Capillary: 187 mg/dL — ABNORMAL HIGH (ref 65–99)

## 2017-10-09 LAB — MAGNESIUM: Magnesium: 2 mg/dL (ref 1.7–2.4)

## 2017-10-09 MED ORDER — INSULIN ASPART 100 UNIT/ML ~~LOC~~ SOLN
0.0000 [IU] | SUBCUTANEOUS | Status: DC
  Administered 2017-10-09 – 2017-10-10 (×7): 3 [IU] via SUBCUTANEOUS
  Administered 2017-10-10: 2 [IU] via SUBCUTANEOUS
  Administered 2017-10-11 (×2): 3 [IU] via SUBCUTANEOUS

## 2017-10-09 MED ORDER — METHYLPREDNISOLONE SODIUM SUCC 40 MG IJ SOLR
40.0000 mg | Freq: Three times a day (TID) | INTRAMUSCULAR | Status: DC
  Administered 2017-10-09 – 2017-10-11 (×6): 40 mg via INTRAVENOUS
  Filled 2017-10-09 (×6): qty 1

## 2017-10-09 MED ORDER — NEPRO/CARBSTEADY PO LIQD
1000.0000 mL | ORAL | Status: DC
  Administered 2017-10-09: 1000 mL
  Filled 2017-10-09 (×3): qty 1000

## 2017-10-09 MED ORDER — PRO-STAT SUGAR FREE PO LIQD
30.0000 mL | Freq: Three times a day (TID) | ORAL | Status: DC
  Administered 2017-10-09 – 2017-10-10 (×3): 30 mL
  Filled 2017-10-09 (×3): qty 30

## 2017-10-09 MED ORDER — RISPERIDONE 0.25 MG PO TABS: 0.5000 mg | ORAL_TABLET | Freq: Every day | ORAL | Status: DC

## 2017-10-09 MED ORDER — METOPROLOL TARTRATE 25 MG PO TABS
50.0000 mg | ORAL_TABLET | Freq: Two times a day (BID) | ORAL | Status: DC
  Administered 2017-10-09 – 2017-10-11 (×4): 50 mg
  Filled 2017-10-09 (×4): qty 2

## 2017-10-09 MED ORDER — RISPERIDONE 0.25 MG PO TABS
0.5000 mg | ORAL_TABLET | Freq: Every day | ORAL | Status: DC
  Administered 2017-10-09 – 2017-10-10 (×2): 0.5 mg
  Filled 2017-10-09 (×2): qty 2

## 2017-10-09 NOTE — Progress Notes (Signed)
Name: Nichole Padilla MRN: [id number] DOB: 29-May-1927    ADMISSION DATE:  09/27/2017 CONSULTATION DATE:  11.24/18  REFERRING MD :  Dr Eliseo Squires  CHIEF COMPLAINT:  Acute resp distress  BRIEF PATIENT DESCRIPTION: Increased wob  83 yr old, concerns chf, fib  SIGNIFICANT EVENTS  11/24 admit, declined, ett, lasix  STUDIES:  ECHO 11/24>>>Left ventricle: The cavity size was normal. Wall thickness was   normal. Systolic function was normal. The estimated ejection   fraction was in the range of 55% to 60%. Features are consistent   with a pseudonormal left ventricular filling pattern, with   concomitant abnormal relaxation and increased filling pressure   (grade 2 diastolic dysfunction). - Aortic valve: Cusp separation was reduced. There was mild to   moderate stenosis. Valve area (VTI): 0.85 cm^2. Valve area   (Vmax): 0.95 cm^2. Valve area (Vmean): 0.94 cm^2. - Mitral valve: Calcified annulus. Mobility of the posterior   leaflet was restricted. There was mild regurgitation. - Left atrium: The atrium was moderately dilated. - Right ventricle: The cavity size was moderately dilated. Wall   thickness was normal. - Right atrium: The atrium was moderately dilated. - Tricuspid valve: There was severe regurgitation directed   centrally. - Pulmonary arteries: PA peak pressure: 71 mm Hg (S).  CT chest 11/26>>>ground galss, rt small effusion, progression since last  SUBJECTIVE: Awake alert on vent.  FiO2 is down  and PEEP is at 5.  She is a chest 1126 is concerning for multilobar pneumonia.  VITAL SIGNS: Temp:  [98 F (36.7 C)-98.5 F (36.9 C)] 98.1 F (36.7 C) (11/28 0800) Pulse Rate:  [43-102] 74 (11/28 0800) Resp:  [23-34] 32 (11/28 0800) BP: (102-180)/(40-72) 180/57 (11/28 0800) SpO2:  [91 %-100 %] 92 % (11/28 0856) FiO2 (%):  [30 %] 30 % (11/28 0856) Weight:  [139 lb 8.8 oz (63.3 kg)] 139 lb 8.8 oz (63.3 kg) (11/28 0500)  PHYSICAL EXAMINATION: General: Frail elderly female on  full mechanical ventilatory support HEENT: Endotracheal tube connected to ventilator PSY: N/A Neuro: Moves all extremities CV: Heart sounds are regular PULM: Decreased breath sounds in the bases.  FiO2 30% PEEP of 5 with O2 saturations 94% negative VO:ZDGU, non-tender, bsx4 active  Extremities: warm/dry, - edema  Skin: no rashes or lesions      Recent Labs  Lab 10/08/17 0500 10/08/17 1809 10/09/17 0516  NA 136 136 136  K 2.7* 3.4* 3.5  CL 101 101 103  CO2 23 21* 21*  BUN 64* 64* 73*  CREATININE 3.25* 3.27* 3.32*  GLUCOSE 195* 194* 194*   Recent Labs  Lab 10/06/17 0500 10/07/17 0500 10/08/17 0500  HGB 7.2* 7.7* 8.4*  HCT 21.6* 23.0* 24.5*  WBC 13.8* 12.1* 14.7*  PLT 105* 96* 108*   Ct Chest Wo Contrast  Result Date: 10/07/2017 CLINICAL DATA:  Acute respiratory failure. EXAM: CT CHEST WITHOUT CONTRAST TECHNIQUE: Multidetector CT imaging of the chest was performed following the standard protocol without IV contrast. COMPARISON:  CT chest 09/29/2017, chest x-ray 10/07/2017 FINDINGS: Cardiovascular: No significant vascular findings. Normal heart size. No pericardial effusion. Coronary artery atherosclerosis involving the LAD, circumflex and RCA. Thoracic aortic atherosclerosis. Mediastinum/Nodes: Enlarged subcarinal lymph node measuring 17 mm. Thyroid gland, trachea, and esophagus demonstrate no significant findings. Lungs/Pleura: Endotracheal tube with the tip 8 mm above the carina. Small bilateral pleural effusions, right greater than left. Bilateral patchy areas of ground-glass opacity and consolidation involving the upper and lower lobes. Upper Abdomen: No acute upper abdominal abnormality. Musculoskeletal:  No acute osseous abnormality. No lytic or sclerotic osseous lesion. Chronic T4, T5 and T12 vertebral body compression fractures. IMPRESSION: 1. Progression of bilateral patchy areas of ground-glass opacity and consolidation involving the upper and lower lobes most concerning  for multilobar pneumonia. An element of pulmonary edema is not excluded. 2. Endotracheal tube with the tip 8 mm above the carina. Recommend retracting the endotracheal tube 1-2 cm. 3.  Aortic Atherosclerosis (ICD10-170.0) Electronically Signed   By: Kathreen Devoid   On: 10/07/2017 11:42   Dg Chest Port 1 View  Result Date: 10/09/2017 CLINICAL DATA:  83 year old female with respiratory failure, evidence of multilobar pneumonia on recent CT. EXAM: PORTABLE CHEST 1 VIEW COMPARISON:  10/08/2017 and earlier. FINDINGS: Portable AP semi upright view at 0506 hours. Endotracheal tube tip in good position between the clavicles and carina. Enteric tube courses to the abdomen, tip not included. Stable right IJ central line. Stable lung volumes. Widespread patchy and confluent bilateral pulmonary opacity with relative sparing only in the left apex. Ventilation appears not significantly changed since 10/07/2017. No pneumothorax. No large pleural effusion. Stable cardiac size and mediastinal contours. Calcified aortic atherosclerosis. IMPRESSION: 1.  Stable lines and tubes. 2. Widespread bilateral pneumonia with stable ventilation since 10/07/2017. Electronically Signed   By: Genevie Ann M.D.   On: 10/09/2017 07:22   Dg Chest Port 1 View  Result Date: 10/08/2017 CLINICAL DATA:  Acute respiratory acidosis EXAM: PORTABLE CHEST 1 VIEW COMPARISON:  10/07/2017 FINDINGS: Endotracheal tube 2.5 cm above the carina unchanged in position. Central venous catheter tip in the cavoatrial junction unchanged. Gastric tube enters the stomach. Diffuse bilateral airspace disease with mild interval improvement. Minimal left effusion. No pneumothorax IMPRESSION: Diffuse bilateral airspace disease with mild interval improvement. Electronically Signed   By: Franchot Gallo M.D.   On: 10/08/2017 06:46    DISCUSSION: 83 year old admitted with increased work of breathing concerns for congestive heart failure questionable pneumonia requiring  intubation.  Currently is a DNR therefore any extubation will be a one-way extubation.  Continue to try to optimize her pulmonary functions to ensure accessible liberation from mechanical ventilatory support.  ASSESSMENT / PLAN:  PULMONARY A: Vent dependent respiratory failure in the setting of volume overload and pneumonia. Radiographically looks like ARDS. P:   Wakeup assessments as tolerated Spontaneous breathing trial daily Negative I&O as tolerated Daily chest x-rays while on ventilator Antimicrobial therapy as noted  CARDIOVASCULAR A:  Diastolic congestive heart failure Atrial flutter atrial fibrillation rapid ventricular response Mild to moderate aortic stenosis and pulmonary hypertension Hypertension P:  Cardiology is following 2D echo demonstrates a left ventricular fraction of 55-60% and grade 2 diastolic dysfunction with moderate left ear and mild MR moderately dilated LA PA pressures of 71 mg Check CVP Antihypertensives   RENAL Lab Results  Component Value Date   CREATININE 3.32 (H) 10/09/2017   CREATININE 3.27 (H) 10/08/2017   CREATININE 3.25 (H) 10/08/2017   Recent Labs  Lab 10/08/17 0500 10/08/17 1809 10/09/17 0516  K 2.7* 3.4* 3.5    A:   Renal insufficiency Hypokalemia  Intake/Output Summary (Last 24 hours) at 10/09/2017 1037 Last data filed at 10/09/2017 1000 Gross per 24 hour  Intake 1551.85 ml  Output 1175 ml  Net 376.85 ml   P:   Follow creatine Replete electrolytes as needed  GASTROINTESTINAL A:   GI protection P:   PPI Tube feeds  HEMATOLOGIC Recent Labs    10/07/17 0500 10/08/17 0500  HGB 7.7* 8.4*    A:  DVT protection P:  Compression hose Put on hold currently low hemoglobin consider restarting  INFECTIOUS A:   Questionable pneumonia P:   Antibiotics per ID section  ENDOCRINE CBG (last 3)  Recent Labs    10/08/17 2339 10/09/17 0336 10/09/17 0722  GLUCAP 171* 179* 191*    A:   Hyperglycemia P:     Sliding scale insulin  NEUROLOGIC A:   Altered mental status P:   RASS goal: 0--1 Sedation as needed for tube tolerance   FAMILY  - Updates: 10/09/2017 no family at bedside  - Inter-disciplinary family meet or Palliative Care meeting due by:  day [place] PCCM Pager [phone] till 1 pm If no answer page [phone] 10/09/2017, 10:42 AM  STAFF NOTE: I, Merrie Roof, MD FACP have personally reviewed patient's available data, including medical history, events of note, physical examination and test results as part of my evaluation. I have discussed with resident/NP and other care providers such as pharmacist, RN and RRT. In addition, I personally evaluated patient and elicited key findings of: rass -3 was 2, not fc, jvd down but remains, ett, lungs sounds reduced slight coarse, abdo soft, reducible hernia, lower ext edema 1, pcxr which I reviewed shows slight improved left infiltrates, unchanged rt , ett wnl given prentration today vs yesterday, was pos 429 cc with continued hold lasix, ctr has plat, will hold lasix further, cvp 8, allow even to slight pos balance, weaning today PS 12-20 failure, back to rest, would consider extubation in future with rr 35-40 if volumes adequate as when I intubated her she was breathing same but O2 needs were the issue which has resolved, repeat pcxr in am , I do not feel e coli is pathogen nor candida, esr and crp concerning, BOOP?, add solumedrol , bronch dry also and would have expected much higher colony count with this degree of infiltrates, tube feeds to remain, empiric abx cefepime 8 days The patient is critically ill with multiple organ systems failure and requires high complexity decision making for assessment and support, frequent evaluation and titration of therapies, application of advanced monitoring technologies and extensive interpretation of multiple databases.   Critical Care Time devoted to patient care services  described in this note is 30 Minutes. This time reflects time of care of this signee: Merrie Roof, MD FACP. This critical care time does not reflect procedure time, or teaching time or supervisory time of PA/NP/Med student/Med Resident etc but could involve care discussion time. Rest per NP/medical resident whose note is outlined above and that I agree with   Nichole Padilla. Nichole Mould, MD, [place] Pgr: [place] 10/09/2017 11:27 AM

## 2017-10-09 NOTE — Progress Notes (Signed)
Nutrition Follow-up  DOCUMENTATION CODES:   Not applicable  INTERVENTION:  - Will adjust TF regimen d/t hyperphosphatemia: Nepro @ 15 mL/hr with 30 mL Prostat TID. This regimen + kcal from current Propofol rate will provide 1236 kcal (95% re-estimated kcal need), 74 grams of protein, and 262 mL free water. - Will order 50 mL free water every 4 hours (300 mL free water/day). - Will monitor for adjustments to Propofol.   NUTRITION DIAGNOSIS:   Inadequate oral intake related to inability to eat as evidenced by NPO status. -ongoing  GOAL:   Patient will meet greater than or equal to 90% of their needs -met with TF regimen  MONITOR:   Labs, Weight trends, Vent status, TF tolerance, I & O's  ASSESSMENT:    83 y.o. female with medical history significant of anemia due to suspected UGI bleed at [facility], recent hospitalization for PNA and discharged on augmentin.  Per family patient is very active at baseline.  She was discharged on Wednesday and did well until Friday when she became very weak and had worsening SOB.  Patient was found to be in a fib during last hospitalization, unsure of time frame for this-- appears new but per PCP: patient with a history of peptic ulcer disease as well as cirrhosis and not a good anticoagulation candidate.    11/28 Pt remains intubated with OGT in place. Pt receiving Vital High Protein @ 40 mL/hr which is providing 960 kcal, 84 grams of protein, and 802 mL free water. PCCM NP note from this AM states possible ARDS, questionable PNA. Weight back to admission weight; will continue to monitor closely.  Patient is currently intubated on ventilator support MV: 11.4 L/min Temp (24hrs), Avg:98.2 F (36.8 C), Min:98 F (36.7 C), Max:98.5 F (36.9 C) Propofol: 10.9 ml/hr (288 kcal) BP: 121/43 and MAP: 70  Medications reviewed; sliding scale Novolog, 40 mg Solu-medrol TID, 40 mg IV Protonix/day. Labs reviewed; CBGs: 179 and 191 mg/dL this AM, BUN: 73 mg/dL,  creatinine: 3.32 mg/dL, Ca: 8.5 mg/dL, Phos: 6 mg/dL, GFR: 13 mL/hr.   Drip: Propofol @ 30 mcg/kg/min.    11/27 - Receiving Vital High Protein @ 35 mL/hr with 30 mL Prostat once/day.  - This regimen is providing 940 kcal, 88 grams of protein, and 702 mL free water.  - Noted hypokalemia and K trend since admission.  - Do not feel that this is related to TF or indication of refeeding given TF regimen unchanged and Mg and Phos have been WDL/Phos elevated on admission and on 11/25.   Patient is currently intubated on ventilator support MV: 12 L/min Temp (24hrs), Avg:98.2 F (36.8 C), Min:97.4 F (36.3 C), Max:99 F (37.2 C) Propofol: 11.4 ml/hr (301 kcal)  BP: 147/42 and MAP: 83  Medications; 40 mEq KCl per OGT x1 dose today Lab; K: 2.7 mmol/L Drip: Propofol @ 30 mcg/kg/min.    11/25 - Patient just discharged from [facility] on 11/21.  - Since that admission, intakes and appetite did not improve.  - Pt emergently intubated 11/24.  - Prior to previous admission, pt was c/o poor appetite.  Patient is currently intubated on ventilator support MV:9.1L/min Temp (24hrs), Avg:97.8 F (36.6 C), Min:97.3 F (36.3 C), Max:99.4 F (37.4 C) Propofol:18.20m/hr -provides 498 kcal      Diet Order:  Diet NPO time specified Except for: Sips with Meds  EDUCATION NEEDS:   No education needs have been identified at this time  Skin:  Skin Assessment: Reviewed RN Assessment  Last BM:  11/28  Height:   Ht Readings from Last 1 Encounters:  10/07/17 _0  (1.6 m)    Weight:   Wt Readings from Last 1 Encounters:  10/09/17 139 lb 8.8 oz (63.3 kg)    Ideal Body Weight:  52.3 kg  BMI:  Body mass index is 24.72 kg/m.  Estimated Nutritional Needs:   Kcal:  1300  Protein:  73-91 grams (1.2-1.5 grams/kg)  Fluid:  1.5L/day      Jarome Matin, MS, RD, LDN, CNSC Inpatient Clinical Dietitian Pager # [phone] After hours/weekend pager # [phone]

## 2017-10-09 NOTE — Progress Notes (Signed)
Inpatient Diabetes Program Recommendations  AACE/ADA: New Consensus Statement on Inpatient Glycemic Control (2015)  Target Ranges:  Prepandial:   less than 140 mg/dL      Peak postprandial:   less than 180 mg/dL (1-2 hours)      Critically ill patients:  140 - 180 mg/dL   Lab Results  Component Value Date   GLUCAP 187 (H) 10/09/2017   HGBA1C 5.9 (H) 09/29/2017    Review of Glycemic Control  On TF. Recommend ICU protocol.  Inpatient Diabetes Program Recommendations:     ICU Glycemic Control Order Set (if appropriate)  Continue to follow.   Thank you. Nichole Padilla, RD, LDN, CDE Inpatient Diabetes Coordinator [phone]

## 2017-10-10 ENCOUNTER — Inpatient Hospital Stay (HOSPITAL_COMMUNITY): Payer: Medicare Other

## 2017-10-10 ENCOUNTER — Encounter (HOSPITAL_COMMUNITY): Payer: Self-pay | Admitting: Radiology

## 2017-10-10 LAB — CBC WITH DIFFERENTIAL/PLATELET
BASOS PCT: 0 %
Basophils Absolute: 0 10*3/uL (ref 0.0–0.1)
EOS ABS: 0 10*3/uL (ref 0.0–0.7)
EOS PCT: 0 %
HCT: 27.8 % — ABNORMAL LOW (ref 36.0–46.0)
Hemoglobin: 9.2 g/dL — ABNORMAL LOW (ref 12.0–15.0)
LYMPHS ABS: 1.5 10*3/uL (ref 0.7–4.0)
Lymphocytes Relative: 7 %
MCH: 29.7 pg (ref 26.0–34.0)
MCHC: 33.1 g/dL (ref 30.0–36.0)
MCV: 89.7 fL (ref 78.0–100.0)
MONOS PCT: 3 %
Monocytes Absolute: 0.6 10*3/uL (ref 0.1–1.0)
Neutro Abs: 19.8 10*3/uL — ABNORMAL HIGH (ref 1.7–7.7)
Neutrophils Relative %: 90 %
PLATELETS: 122 10*3/uL — AB (ref 150–400)
RBC: 3.1 MIL/uL — AB (ref 3.87–5.11)
RDW: 20.4 % — ABNORMAL HIGH (ref 11.5–15.5)
WBC: 21.9 10*3/uL — AB (ref 4.0–10.5)

## 2017-10-10 LAB — GLUCOSE, CAPILLARY
GLUCOSE-CAPILLARY: 228 mg/dL — AB (ref 65–99)
GLUCOSE-CAPILLARY: 201 mg/dL — AB (ref 65–99)
GLUCOSE-CAPILLARY: 222 mg/dL — AB (ref 65–99)
Glucose-Capillary: 185 mg/dL — ABNORMAL HIGH (ref 65–99)
Glucose-Capillary: 201 mg/dL — ABNORMAL HIGH (ref 65–99)
Glucose-Capillary: 242 mg/dL — ABNORMAL HIGH (ref 65–99)

## 2017-10-10 LAB — CULTURE, BLOOD (ROUTINE X 2)
CULTURE: NO GROWTH
Culture: NO GROWTH
SPECIAL REQUESTS: ADEQUATE
Special Requests: ADEQUATE

## 2017-10-10 LAB — BASIC METABOLIC PANEL
Anion gap: 14 (ref 5–15)
BUN: 92 mg/dL — AB (ref 6–20)
CO2: 20 mmol/L — ABNORMAL LOW (ref 22–32)
CREATININE: 3.54 mg/dL — AB (ref 0.44–1.00)
Calcium: 8.8 mg/dL — ABNORMAL LOW (ref 8.9–10.3)
Chloride: 101 mmol/L (ref 101–111)
GFR, EST AFRICAN AMERICAN: 12 mL/min — AB (ref >60)
GFR, EST NON AFRICAN AMERICAN: 11 mL/min — AB (ref >60)
Glucose, Bld: 230 mg/dL — ABNORMAL HIGH (ref 65–99)
Potassium: 3.7 mmol/L (ref 3.5–5.1)
SODIUM: 135 mmol/L (ref 135–145)

## 2017-10-10 LAB — TRIGLYCERIDES: TRIGLYCERIDES: 206 mg/dL — AB (ref <150)

## 2017-10-10 LAB — PHOSPHORUS: Phosphorus: 7.6 mg/dL — ABNORMAL HIGH (ref 2.5–4.6)

## 2017-10-10 LAB — MAGNESIUM: MAGNESIUM: 2.3 mg/dL (ref 1.7–2.4)

## 2017-10-10 MED ORDER — PANTOPRAZOLE SODIUM 40 MG PO PACK
40.0000 mg | PACK | Freq: Every day | ORAL | Status: DC
  Administered 2017-10-10 – 2017-10-11 (×2): 40 mg
  Filled 2017-10-10 (×2): qty 20

## 2017-10-10 MED ORDER — DEXTROSE 5 % IV SOLN
500.0000 mg | INTRAVENOUS | Status: DC
  Administered 2017-10-10: 500 mg via INTRAVENOUS
  Filled 2017-10-10: qty 0.5

## 2017-10-10 MED ORDER — AMLODIPINE BESYLATE 10 MG PO TABS
10.0000 mg | ORAL_TABLET | Freq: Every day | ORAL | Status: DC
  Administered 2017-10-10: 10 mg via ORAL
  Filled 2017-10-10 (×2): qty 1

## 2017-10-10 MED ORDER — NEPRO/CARBSTEADY PO LIQD
1000.0000 mL | ORAL | Status: DC
  Administered 2017-10-10: 1000 mL
  Filled 2017-10-10: qty 1000

## 2017-10-10 MED ORDER — PRO-STAT SUGAR FREE PO LIQD
30.0000 mL | Freq: Two times a day (BID) | ORAL | Status: DC
  Administered 2017-10-10 – 2017-10-11 (×2): 30 mL
  Filled 2017-10-10 (×2): qty 30

## 2017-10-10 NOTE — Progress Notes (Addendum)
Name: Nichole Padilla MRN: [id number] DOB: [birth year]    ADMISSION DATE:  09/25/2017 CONSULTATION DATE:  11.24/18  REFERRING MD :  Dr Eliseo Squires  CHIEF COMPLAINT:  Acute resp distress  BRIEF PATIENT DESCRIPTION: 83 y/o F admitted with SOB in the setting of E-Coli PNA, +/- CHF.     SUBJECTIVE:  RT reports pt weaned for ~5 hours yesterday.  On PSV wean today.  Tachypnea on PRVC & unchanged on PSV.  Vt 330-350 on 5/5. PS increased to 10.  Remains on propofol at 45 mcg's.    VITAL SIGNS: Temp:  [97.5 F (36.4 C)-100.6 F (38.1 C)] 97.5 F (36.4 C) (11/29 0800) Pulse Rate:  [72-104] 96 (11/29 0900) Resp:  [15-35] 32 (11/29 0900) BP: (143-188)/(36-73) 167/42 (11/29 0900) SpO2:  [89 %-100 %] 93 % (11/29 0900) FiO2 (%):  [30 %] 30 % (11/29 0745) Weight:  [139 lb 15.9 oz (63.5 kg)] 139 lb 15.9 oz (63.5 kg) (11/29 0500)  PHYSICAL EXAMINATION: General: elderly female in NAD on vent HEENT: MM pink/dry with coating on tongue Neuro: sedate, no distress  CV: s1s2 irr, ?SEM 2/6 PULM: even/non-labored, lungs bilaterally coarse  TF:TDDU, non-tender, bsx4 active  Extremities: warm/dry, trace edema  Skin: no rashes or lesions  Recent Labs  Lab 10/08/17 1809 10/09/17 0516 10/10/17 0505  NA 136 136 135  K 3.4* 3.5 3.7  CL 101 103 101  CO2 21* 21* 20*  BUN 64* 73* 92*  CREATININE 3.27* 3.32* 3.54*  GLUCOSE 194* 194* 230*   Recent Labs  Lab 10/07/17 0500 10/08/17 0500 10/10/17 0505  HGB 7.7* 8.4* 9.2*  HCT 23.0* 24.5* 27.8*  WBC 12.1* 14.7* 21.9*  PLT 96* 108* 122*   Dg Chest Port 1 View  Result Date: 10/10/2017 CLINICAL DATA:  Respiratory failure EXAM: PORTABLE CHEST 1 VIEW COMPARISON:  10/09/2017 FINDINGS: Endotracheal tube in good position. NG tube in the stomach. Central venous catheter tip in the lower SVC unchanged. Diffuse bilateral airspace disease unchanged from the prior study. Small pleural effusions. Negative for pneumothorax. IMPRESSION: Support lines remain in  good position Extensive bilateral airspace disease unchanged. Small bilateral effusions. Electronically Signed   By: Franchot Gallo M.D.   On: 10/10/2017 07:45   Dg Chest Port 1 View  Result Date: 10/09/2017 CLINICAL DATA:  83 year old female with respiratory failure, evidence of multilobar pneumonia on recent CT. EXAM: PORTABLE CHEST 1 VIEW COMPARISON:  10/08/2017 and earlier. FINDINGS: Portable AP semi upright view at 0506 hours. Endotracheal tube tip in good position between the clavicles and carina. Enteric tube courses to the abdomen, tip not included. Stable right IJ central line. Stable lung volumes. Widespread patchy and confluent bilateral pulmonary opacity with relative sparing only in the left apex. Ventilation appears not significantly changed since 10/07/2017. No pneumothorax. No large pleural effusion. Stable cardiac size and mediastinal contours. Calcified aortic atherosclerosis. IMPRESSION: 1.  Stable lines and tubes. 2. Widespread bilateral pneumonia with stable ventilation since 10/07/2017. Electronically Signed   By: Genevie Ann M.D.   On: 10/09/2017 07:22    SIGNIFICANT EVENTS  11/24  Admit, declined, ett, lasix 11/28 Awake on vent.  FiO2 is down/PEEP 5.  Concern for multilobar pneumonia.  STUDIES:  ECHO 11/24 >> LVEF 20-25%, grade 2 diastolic dysfunction, AV mild to mod stenosis, LA moderately dilated, RA / RV moderately dilated, severe TR, PA peak 71 mm Hg CT chest 11/26 >> ground galss, rt small effusion, progression since last  CULTURES BAL 11/24 >> 40k E-coli, S- ceftazidime  BCx2 11/24 >>   ANTIBIOTICS Ceftazidime 11/24 >>   DISCUSSION: 83 year old admitted with increased work of breathing concerns for congestive heart failure vs questionable pneumonia requiring intubation.  Currently is a DNR therefore any extubation will be a one-way extubation.  Continue to try to optimize her pulmonary functions to ensure accessible liberation from mechanical ventilatory  support.  ASSESSMENT / PLAN:  PULMONARY A: Acute Hypoxic Respiratory Failure - in setting of bilateral airspace disease, PNA vs Edema (or both) E-Coli CAP  P:   Solumedrol 40 mg IV Q8 Continue abx as above Intermittent CXR  Daily SBT / WUA as tolerated Negative balance as able  DNR in the event of arrest  CARDIOVASCULAR A:  Diastolic congestive heart failure Atrial flutter atrial fibrillation rapid ventricular response Mild to moderate aortic stenosis and pulmonary hypertension Hypertension P:  Cardiology following, appreciate input  ECHO as above  Follow CVP  Lopressor 50 mg BID  Resume home norvasc 10 mg QD BP control   RENAL A:   Renal insufficiency Hypokalemia P:   Trend BMP / urinary output Replace electrolytes as indicated Avoid nephrotoxic agents, ensure adequate renal perfusion Consider lasix but note rising renal function, hold for now  GASTROINTESTINAL A:   GI protection P:   NPO  PPI for SUP  TF per Nutrition   HEMATOLOGIC A:   Anemia DVT protection P:  SCD's  No heparin products for now with anemia  Assess FOBT   INFECTIOUS A:   Questionable pneumonia P:   Follow cultures  ABX as above   ENDOCRINE A:   Hyperglycemia P:   CBG with SSI   NEUROLOGIC A:   Altered mental status P:   RASS goal: 0 to -1  Propofol for sedation  PRN fentanyl for pain    FAMILY  - Updates:   No family at bedside am 11/29.  Will update on arrival.    - Global:  ICU status.  DNR in the event of arrest.     CC Time:  61 minutes    Noe Gens, NP-C [place] [facility] Pgr: [phone] or if no answer [phone] 10/10/2017, 9:17 AM  STAFF NOTE: I, Merrie Roof, MD FACP have personally reviewed patient's available data, including medical history, events of note, physical examination and test results as part of my evaluation. I have discussed with resident/NP and other care providers such as pharmacist, RN and RRT. In addition, I  personally evaluated patient and elicited key findings of: sedated , rass -3, perrl, not fc with wua, jvd up, coarse BS crackles, even balance 213 cc, gen edema, pcxr which I reviewed shows about same infiltrates bilateral, maybe better aeration left, ett wnl, penetration difference noted compared to prior, I am reluctant to re add diuresis with 30% fio2 needs and about unchanged infiltrates on pcxr, wean aggressive cpap 5 ps 5, goal Tv 5 cc/kg, rate 35 or less, some hemoconcentration on cbc, wbc also from steroids, continued steroids empiric for now, remains on course ABX PNA , but unimpressed, esr was very high as far as NON infectious pNA, GI bleed, anemia = no heparin, if no changes in neuro status, will need CT head, low dose rispirdall may need bid , assess prop ability to WUA, will re discuss with daughter goals, of extubation and NO reintubation Family would NOT WANT HD under any circumstances, assess TG, they also do not want reintubation when we plan erxtubation The patient is critically ill with multiple organ systems failure and  requires high complexity decision making for assessment and support, frequent evaluation and titration of therapies, application of advanced monitoring technologies and extensive interpretation of multiple databases.   Critical Care Time devoted to patient care services described in this note is 35 Minutes. This time reflects time of care of this signee: Merrie Roof, MD FACP. This critical care time does not reflect procedure time, or teaching time or supervisory time of PA/NP/Med student/Med Resident etc but could involve care discussion time. Rest per NP/medical resident whose note is outlined above and that I agree with   Lavon Paganini. Titus Mould, MD, [place], [place] Pgr: [place] [facility] 10/10/2017 10:30 AM

## 2017-10-10 NOTE — Progress Notes (Signed)
Nutrition Follow-up  DOCUMENTATION CODES:   Not applicable  INTERVENTION:  - Will adjust TF regimen: Nepro @ 30 mL/hr with 30 mL Prostat BID. This regimen will provide 1496 kcal (98% estimated kcal need), 88 grams of protein, and 523 mL free water. - If free water flush desired, to be per MD or NP.   NUTRITION DIAGNOSIS:   Inadequate oral intake related to inability to eat as evidenced by NPO status. -ongoing  GOAL:   Patient will meet greater than or equal to 90% of their needs -met with TF regimen  MONITOR:   Labs, Weight trends, Vent status, TF tolerance, I & O's  ASSESSMENT:    83 y.o. female with medical history significant of anemia due to suspected UGI bleed at [facility], recent hospitalization for PNA and discharged on augmentin.  Per family patient is very active at baseline.  She was discharged on Wednesday and did well until Friday when she became very weak and had worsening SOB.  Patient was found to be in a fib during last hospitalization, unsure of time frame for this-- appears new but per PCP: patient with a history of peptic ulcer disease as well as cirrhosis and not a good anticoagulation candidate.    11/29 Pt remains intubated with OGT in place. She is receiving Nepro @ 15 mL/hr with 30 mL Prostat TID. This regimen is providing 948 kcal, 74 grams of protein, and 263 mL free water. Propofol now off. Weight has been stable since yesterday. Reviewed Dr. Janit Pagan note in detail concerning plan now and moving forward concerning intubation/re-intubation and renal function.   Patient is currently intubated on ventilator support MV: 11.9 L/min Temp (24hrs), Avg:98.8 F (37.1 C), Min:97.5 F (36.4 C), Max:100.6 F (38.1 C) Propofol: OFF BP: 169/49 and MAP: 93  Medications reviewed; sliding scale Novolog, 40 mg Solu-medrol TID, 40 mg Protonix per OGT/day. Labs reviewed; CBGs: 185 and 228 mg/dL this AM, BUN: 92 mg/dL, creatinine: 3.54 mg/dL, Ca: 8.8 mg/dL, Phos: 7.6  mg/dL, GFR: 12 mL/min.      11/28 - Pt receiving Vital High Protein @ 40 mL/hr which is providing 960 kcal, 84 grams of protein, and 802 mL free water.  - PCCM NP note from this AM states possible ARDS, questionable PNA.  - Weight back to admission weight.  Patient is currently intubated on ventilator support MV: 11.4 L/min Temp (24hrs), Avg:98.2 F (36.8 C), Min:98 F (36.7 C), Max:98.5 F (36.9 C) Propofol: 10.9 ml/hr (288 kcal) BP: 121/43 and MAP: 70 Labs; Phos: 6 mg/dL Drip: Propofol @ 30 mcg/kg/min.    11/27 - Receiving Vital High Protein @ 35 mL/hr with 30 mL Prostat once/day.  - This regimen is providing 940 kcal, 88 grams of protein, and 702 mL free water.  - Noted hypokalemia and K trend since admission.  - Do not feel that this is related to TF or indication of refeeding given TF regimen unchanged and Mg and Phos have been WDL/Phos elevated on admission and on 11/25.  Patient is currently intubated on ventilator support MV:12L/min Temp (24hrs), Avg:98.2 F (36.8 C), Min:97.4 F (36.3 C), Max:99 F (37.2 C) Propofol:11.24m/hr (301 kcal)  BP: 147/42 and MAP: 83  Medications; 40 mEq KCl per OGT x1 dose today Lab; K: 2.7 mmol/L Drip:Propofol @ 30 mcg/kg/min.       Diet Order:  Diet NPO time specified Except for: Sips with Meds  EDUCATION NEEDS:   No education needs have been identified at this time  Skin:  Skin Assessment: Reviewed RN Assessment  Last BM:  11/29  Height:   Ht Readings from Last 1 Encounters:  10/07/17 _0  (1.6 m)    Weight:   Wt Readings from Last 1 Encounters:  10/10/17 139 lb 15.9 oz (63.5 kg)    Ideal Body Weight:  52.3 kg  BMI:  Body mass index is 24.8 kg/m.  Estimated Nutritional Needs:   Kcal:  4158  Protein:  73-91 grams (1.2-1.5 grams/kg)  Fluid:  1.5L/day      Jarome Matin, MS, RD, LDN, CNSC Inpatient Clinical Dietitian Pager # [phone] After hours/weekend pager # [phone]

## 2017-10-10 NOTE — Progress Notes (Signed)
Pharmacy Antibiotic Note  Nichole SellsConstance Padilla is a 83 y.o. female admitted on 2017-05-22 with HCAP.   [place] and discharged on augmentin. Pt received vanc and cefepime x 1 in ED. Pharmacy has been consulted for ceftazidime dosing.  Today, 10/10/2017: - day #6/8 abx - Tmax 100.6, wbc 21.9 - scr trending up 3.54 (crcl~<10), UOP 0.6-0.7 -   Plan: - adjust Ceftazidime to 500 mg  IV q24h (last dose on 12/01) - Follow renal function, cultures and clinical course  ________________________________  Height: 5\' 3"  (160 cm) Weight: 139 lb 15.9 oz (63.5 kg) IBW/kg (Calculated) : 52.4  Temp (24hrs), Avg:98.8 F (37.1 C), Min:97.5 F (36.4 C), Max:100.6 F (38.1 C)  Recent Labs  Lab 08/15/17 0354 08/15/17 0617  08/15/17 1642 10/06/17 0500  10/07/17 0500 10/08/17 0500 10/08/17 1809 10/09/17 0516 10/10/17 0505  WBC 22.4*  --   --   --  13.8*  --  12.1* 14.7*  --   --  21.9*  CREATININE 2.12*  --    < >  --  2.39*   < > 3.00* 3.25* 3.27* 3.32* 3.54*  LATICACIDVEN  --  3.56*  --  1.3  --   --   --   --   --   --   --    < > = values in this interval not displayed.    Estimated Creatinine Clearance: 9.8 mL/min (A) (by C-G formula based on SCr of 3.54 mg/dL (H)).    No Known Allergies  ntimicrobials this admission:  11/24 vanc x 1 11/24 cefepime x 1 11/24 ceftaz >> (12/1)  Dose adjustments this admission:  11/26 Ceftaz decreased for worsening renal fxn  Microbiology results:  11/24 BCx: ngtd 11/24 bronc lavage: 40k Candida, 3k Ecoli (suspect these are not likely pathogenic organisms d/t low growth) FINAL 11/24 MRSA PCR: neg  Thank you for allowing pharmacy to be a part of this patient's care.  Dorna LeitzAnh Bebe Moncure, PharmD, BCPS 10/10/2017 8:36 AM

## 2017-10-11 ENCOUNTER — Inpatient Hospital Stay (HOSPITAL_COMMUNITY): Payer: Medicare Other

## 2017-10-11 DIAGNOSIS — N171 Acute kidney failure with acute cortical necrosis: Secondary | ICD-10-CM

## 2017-10-11 DIAGNOSIS — Z5309 Procedure and treatment not carried out because of other contraindication: Secondary | ICD-10-CM

## 2017-10-11 DIAGNOSIS — I4892 Unspecified atrial flutter: Secondary | ICD-10-CM

## 2017-10-11 LAB — CBC
HCT: 26.6 % — ABNORMAL LOW (ref 36.0–46.0)
HEMOGLOBIN: 8.7 g/dL — AB (ref 12.0–15.0)
MCH: 30.2 pg (ref 26.0–34.0)
MCHC: 32.7 g/dL (ref 30.0–36.0)
MCV: 92.4 fL (ref 78.0–100.0)
PLATELETS: 120 10*3/uL — AB (ref 150–400)
RBC: 2.88 MIL/uL — ABNORMAL LOW (ref 3.87–5.11)
RDW: 21 % — AB (ref 11.5–15.5)
WBC: 24.4 10*3/uL — ABNORMAL HIGH (ref 4.0–10.5)

## 2017-10-11 LAB — PHOSPHORUS: PHOSPHORUS: 9.6 mg/dL — AB (ref 2.5–4.6)

## 2017-10-11 LAB — BASIC METABOLIC PANEL
Anion gap: 17 — ABNORMAL HIGH (ref 5–15)
BUN: 125 mg/dL — AB (ref 6–20)
CHLORIDE: 101 mmol/L (ref 101–111)
CO2: 19 mmol/L — ABNORMAL LOW (ref 22–32)
CREATININE: 4.29 mg/dL — AB (ref 0.44–1.00)
Calcium: 8.9 mg/dL (ref 8.9–10.3)
GFR, EST AFRICAN AMERICAN: 10 mL/min — AB (ref >60)
GFR, EST NON AFRICAN AMERICAN: 8 mL/min — AB (ref >60)
Glucose, Bld: 248 mg/dL — ABNORMAL HIGH (ref 65–99)
POTASSIUM: 4 mmol/L (ref 3.5–5.1)
SODIUM: 137 mmol/L (ref 135–145)

## 2017-10-11 LAB — GLUCOSE, CAPILLARY
GLUCOSE-CAPILLARY: 224 mg/dL — AB (ref 65–99)
GLUCOSE-CAPILLARY: 221 mg/dL — AB (ref 65–99)
Glucose-Capillary: 236 mg/dL — ABNORMAL HIGH (ref 65–99)

## 2017-10-11 LAB — MAGNESIUM: MAGNESIUM: 2.8 mg/dL — AB (ref 1.7–2.4)

## 2017-10-11 MED ORDER — MORPHINE BOLUS VIA INFUSION
5.0000 mg | INTRAVENOUS | Status: DC | PRN
  Filled 2017-10-11: qty 20

## 2017-10-11 MED ORDER — FUROSEMIDE 10 MG/ML IJ SOLN
160.0000 mg | Freq: Two times a day (BID) | INTRAVENOUS | Status: DC
  Administered 2017-10-11: 160 mg via INTRAVENOUS
  Filled 2017-10-11: qty 10

## 2017-10-11 MED ORDER — DILTIAZEM HCL-DEXTROSE 100-5 MG/100ML-% IV SOLN (PREMIX)
5.0000 mg/h | INTRAVENOUS | Status: DC
  Administered 2017-10-11: 5 mg/h via INTRAVENOUS
  Filled 2017-10-11: qty 100

## 2017-10-11 MED ORDER — LIP MEDEX EX OINT
TOPICAL_OINTMENT | CUTANEOUS | Status: AC
  Administered 2017-10-11: 18:00:00
  Filled 2017-10-11: qty 7

## 2017-10-11 MED ORDER — MORPHINE SULFATE 25 MG/ML IV SOLN
10.0000 mg/h | INTRAVENOUS | Status: DC
  Administered 2017-10-11: 10 mg/h via INTRAVENOUS
  Filled 2017-10-11: qty 10

## 2017-10-11 NOTE — Progress Notes (Signed)
Chaplain consulted by nursing for terminal wean.  2 daughters and son-in-law at bedside.    Provided spiritual support around anticipatory grief and EOL through pastoral presence, narrative review, eulogy, prayers at bedside.  Will continue to follow for support through hospitalization.  Family anticipates other relatives and community minister visiting.  Family is [place] and draws from their tradition for coping with loss.  Daughter read psalm 23 to patient, as patient had read this to granddaughter who died at 766 years old, as well as to husband who died 7 months prior.   Family understands prognosis and relates that they do not want their mother to suffer.    [facility] / [facility] Chaplain Burnis KingfisherMatthew Marisa Hage, [place]  Office [phone]  Page [phone]

## 2017-10-11 NOTE — Progress Notes (Addendum)
Date: October 11, 2017 Marcelle SmilingRhonda Davis, BSN, BaileyRN3, ConnecticutCCM  [phone] Chart and notes review for patient progress and needs./remains on full vent support/weaning challenges are ongoing. Did 5 hours on [id number]. Will follow for case management and discharge needs. Next review date: [id number]

## 2017-10-11 NOTE — Procedures (Signed)
Extubation Procedure Note  Patient Details:   Name: Nichole SellsConstance Padilla DOB: Jun 15, 1927 MRN: [id number]   Airway Documentation:     Evaluation  O2 sats: 93 Patient tolerated procedure well. Bilateral Breath Sounds: Clear, Diminished   Per CCM order, pt extubated and placed on nasal cannula.  Revonda HumphreyDePue, [name] S 10/11/2017, 12:17 PM

## 2017-10-11 NOTE — Progress Notes (Addendum)
During report patient heart rate increased from 94-152. 12 lead EKG performed and showed A flutter with AV block. Rounding cardiology MD on floor notified and showed EKG. Patients BP stable at 157/66. RN to continue to monitor.

## 2017-10-11 NOTE — Progress Notes (Signed)
Visited with family at bedside.  Plan of care reviewed with daughter and family in room per Dr. Tyson AliasFeinstein.  Will proceed with full comfort measures.    Plan: Initiate morphine gtt for comfort Once comfortable on morphine, will remove ETT  Discontinue all measures that do not add to patients comfort  [facility] notified of plan of care    Canary BrimBrandi Ollivander See, NP-C [facility] Pgr: [phone] or if no answer [phone] 10/11/2017, 11:08 AM

## 2017-10-11 NOTE — Progress Notes (Addendum)
Name: Nichole Padilla MRN: [id number] DOB: [birth year]    ADMISSION DATE:  10/02/2017 CONSULTATION DATE:  11.24/18  REFERRING MD :  Dr Eliseo Squires  CHIEF COMPLAINT:  Acute resp distress  BRIEF PATIENT DESCRIPTION: 83 y/o F admitted with SOB in the setting of E-Coli PNA, +/- CHF.     SUBJECTIVE:flutter rvr, dilt drip started Ct head done Prop off    VITAL SIGNS: Temp:  [98.1 F (36.7 C)-100.3 F (37.9 C)] 99.1 F (37.3 C) (11/30 0800) Pulse Rate:  [68-147] 121 (11/30 0800) Resp:  [14-34] 28 (11/30 0800) BP: (115-169)/(29-67) 132/67 (11/30 0800) SpO2:  [93 %-99 %] 94 % (11/30 0815) FiO2 (%):  [30 %] 30 % (11/30 0822) Weight:  [63.5 kg (139 lb 15.9 oz)] 63.5 kg (139 lb 15.9 oz) (11/30 0500)  PHYSICAL EXAMINATION: General: not awake, perrl Neuro: perrl, not openings eyes, some grimace, moves feet HEENT: jvd up ett PULM: CTA , slight coarse CV: s1 s2 IRT 117 GI: soft, Bs wnl, no r Extremities: edema 1   Recent Labs  Lab 10/09/17 0516 10/10/17 0505 10/11/17 0656  NA 136 135 137  K 3.5 3.7 4.0  CL 103 101 101  CO2 21* 20* 19*  BUN 73* 92* 125*  CREATININE 3.32* 3.54* 4.29*  GLUCOSE 194* 230* 248*   Recent Labs  Lab 10/08/17 0500 10/10/17 0505 10/11/17 0656  HGB 8.4* 9.2* 8.7*  HCT 24.5* 27.8* 26.6*  WBC 14.7* 21.9* 24.4*  PLT 108* 122* 120*   Ct Head Wo Contrast  Result Date: 10/10/2017 CLINICAL DATA:  83 y/o F; unexplained altered level of consciousness. EXAM: CT HEAD WITHOUT CONTRAST TECHNIQUE: Contiguous axial images were obtained from the base of the skull through the vertex without intravenous contrast. COMPARISON:  None. FINDINGS: Brain: No evidence of acute infarction, hemorrhage, hydrocephalus, extra-axial collection or mass lesion/mass effect. Mild chronic microvascular ischemic changes and parenchymal volume loss of the brain for age. Vascular: Axially atherosclerosis of carotid siphons. No hyperdense vessel. Skull: Normal. Negative for fracture or  focal lesion. Sinuses/Orbits: No acute finding. Bilateral intra-ocular lens replacement. Other: None. IMPRESSION: 1. No acute intracranial abnormality identified. 2. Mild chronic microvascular ischemic changes and parenchymal volume loss of the brain for age. Electronically Signed   By: Kristine Garbe M.D.   On: 10/10/2017 15:48   Dg Chest Port 1 View  Result Date: 10/10/2017 CLINICAL DATA:  Respiratory failure EXAM: PORTABLE CHEST 1 VIEW COMPARISON:  10/09/2017 FINDINGS: Endotracheal tube in good position. NG tube in the stomach. Central venous catheter tip in the lower SVC unchanged. Diffuse bilateral airspace disease unchanged from the prior study. Small pleural effusions. Negative for pneumothorax. IMPRESSION: Support lines remain in good position Extensive bilateral airspace disease unchanged. Small bilateral effusions. Electronically Signed   By: Franchot Gallo M.D.   On: 10/10/2017 07:45    SIGNIFICANT EVENTS  11/24  Admit, declined, ett, lasix 11/28 Awake on vent.  FiO2 is down/PEEP 5.  Concern for multilobar pneumonia.  STUDIES:  ECHO 11/24 >> LVEF 64-33%, grade 2 diastolic dysfunction, AV mild to mod stenosis, LA moderately dilated, RA / RV moderately dilated, severe TR, PA peak 71 mm Hg CT chest 11/26 >> ground galss, rt small effusion, progression since last  CULTURES BAL 11/24 >> 40k E-coli, S- ceftazidime BCx2 11/24 >>   ANTIBIOTICS Ceftazidime 11/24 >>>stop dec 1  DISCUSSION: 83 year old admitted with increased work of breathing concerns for congestive heart failure vs questionable pneumonia requiring intubation.  Currently is a DNR therefore any extubation  will be a one-way extubation.  Continue to try to optimize her pulmonary functions to ensure accessible liberation from mechanical ventilatory support.  ASSESSMENT / PLAN:  PULMONARY A: Acute Hypoxic Respiratory Failure  Possible HCAP Rule out non infectious pneumonitis P:   DNi when extubated per family  discussion with me pcxr which I reviewed shows about unchanged infiltrates extensive bilateral Steroids empiric to remain Wean this am cpap 5 ps5, goal 1 hour, assess rsbi I feel strongly that she will not wean better in days, we have maxed efforts and vent only carrying discomfort and risk VAP Family to decide on extubation and no reintubation Upright as able She is oN low O2, would tolerate rr 35 Add lasix, wont change overall renal outcome Comfort if fails  CARDIOVASCULAR A:  Diastolic congestive heart failure Atrial flutter atrial fibrillation rapid ventricular response Mild to moderate aortic stenosis and pulmonary hypertension Hypertension P:  dilt drip Follow CVP  Lopressor 50 mg BID  norvasc 10 mg QD - dc BP control   RENAL A:   ARF, progressive despite volume Hypokalemia P:   Family not want any forms of renal replacement Add lasix 160 bid, this will not change her overall renal outcome, may improve edema as far as extubationsuccess hopes Chem in am  CKD stage 5 now  GASTROINTESTINAL A:   GI protection P:   NPO  PPI for SUP  TF per Nutrition - holding   HEMATOLOGIC A:   Anemia DVT protection leukocytosis P:  SCD's  No heparin products for now with anemia   INFECTIOUS A:   Questionable pneumonia, Unlikely P:   abx add stop date 12/1  ENDOCRINE A:   Hyperglycemia P:   CBG with SSI  No lantus as will go NPO now  NEUROLOGIC A:   Hypoactive delirium, encephalopathy from ARF, MODS P:   RASS goal: 0 to -1  Propofol dc CT head I reviewed, no acute Risperdal, hold Looking at her this am, I feel we should go straight to comfort care, I d/w Rene Kocher, she will ask sister   FAMILY  - Updates:  Update family daily  - Global:  ICU status.  DNR in the event of arrest.  Also DNI, comfort if fails   CC Time:  35 minutes    Lavon Paganini. Titus Mould, MD, [place] Pgr: [facility] 10/11/2017 8:53 AM

## 2017-10-11 NOTE — Progress Notes (Signed)
 Progress Note  Patient Name: Nichole Padilla Date of Encounter: 10/11/2017  Primary Cardiologist: New to Dr.    Subjective   Intubated, sedated.  Inpatient Medications    Scheduled Meds: . amLODipine  10 mg Oral Daily  . chlorhexidine gluconate (MEDLINE KIT)  15 mL Mouth Rinse BID  . Chlorhexidine Gluconate Cloth  6 each Topical Q0600  . feeding supplement (NEPRO CARB STEADY)  1,000 mL Per Tube Q24H  . feeding supplement (PRO-STAT SUGAR FREE 64)  30 mL Per Tube BID  . insulin aspart  0-9 Units Subcutaneous Q4H  . mouth rinse  15 mL Mouth Rinse QID  . methylPREDNISolone (SOLU-MEDROL) injection  40 mg Intravenous Q8H  . metoprolol tartrate  50 mg Per Tube BID  . pantoprazole sodium  40 mg Per Tube Daily  . risperiDONE  0.5 mg Per Tube QHS  . sodium chloride flush  3 mL Intravenous Q12H   Continuous Infusions: . sodium chloride 250 mL (10/11/17 0710)  . cefTAZidime (FORTAZ)  IV Stopped (10/11/17 0024)  . propofol (DIPRIVAN) infusion 44.902 mcg/kg/min (10/11/17 0710)   PRN Meds: sodium chloride, acetaminophen **OR** acetaminophen, fentaNYL (SUBLIMAZE) injection, hydrALAZINE, ondansetron **OR** ondansetron (ZOFRAN) IV, sodium chloride flush   Vital Signs    Vitals:   10/11/17 0600 10/11/17 0700 10/11/17 0711 10/11/17 0712  BP: (!) 157/41 (!) 165/49    Pulse: 89 93 94 (!) 147  Resp: 14 (!) 22 (!) 27 (!) 29  Temp:      TempSrc:      SpO2: 95% 95% 95% 95%  Weight:      Height:        Intake/Output Summary (Last 24 hours) at 10/11/2017 0752 Last data filed at 10/11/2017 0710 Gross per 24 hour  Intake 1238.41 ml  Output 755 ml  Net 483.41 ml   Filed Weights   10/09/17 0500 10/10/17 0500 10/11/17 0500  Weight: 139 lb 8.8 oz (63.3 kg) 139 lb 15.9 oz (63.5 kg) 139 lb 15.9 oz (63.5 kg)    Telemetry    afib at rate of 80-90s - Personally Reviewed  ECG    N/A  Physical Exam   GEN: ill appearing female on support, No acute distress.   Neck: No  JVD Cardiac: RRR, no murmurs, rubs, or gallops.  Respiratory: Course breath sound bilaterally. GI: Soft, nontender, non-distended  MS: No edema; No deformity. Neuro:  Nonfocal  Psych: Normal affect   Labs    Chemistry Recent Labs  Lab 10/07/17 0500  10/08/17 1809 10/09/17 0516 10/10/17 0505  NA 135   < > 136 136 135  K 3.5   < > 3.4* 3.5 3.7  CL 103   < > 101 103 101  CO2 24   < > 21* 21* 20*  GLUCOSE 170*   < > 194* 194* 230*  BUN 49*   < > 64* 73* 92*  CREATININE 3.00*   < > 3.27* 3.32* 3.54*  CALCIUM 8.4*   < > 8.4* 8.5* 8.8*  PROT 5.9*  --   --   --   --   ALBUMIN 2.1*  --   --   --   --   AST 27  --   --   --   --   ALT 13*  --   --   --   --   ALKPHOS 77  --   --   --   --   BILITOT 0.7  --   --   --   --     GFRNONAA 13*   < > 12* 11* 11*  GFRAA 15*   < > 14* 13* 12*  ANIONGAP 8   < > 14 12 14   < > = values in this interval not displayed.     Hematology Recent Labs  Lab 10/08/17 0500 10/10/17 0505 10/11/17 0656  WBC 14.7* 21.9* 24.4*  RBC 2.73* 3.10* 2.88*  HGB 8.4* 9.2* 8.7*  HCT 24.5* 27.8* 26.6*  MCV 89.7 89.7 92.4  MCH 30.8 29.7 30.2  MCHC 34.3 33.1 32.7  RDW 18.9* 20.4* 21.0*  PLT 108* 122* 120*    Cardiac Enzymes Recent Labs  Lab 09/26/2017 1627 10/06/17 0500  TROPONINI 0.06* 0.05*    Recent Labs  Lab 09/28/2017 0402  TROPIPOC 0.03     BNP Recent Labs  Lab 09/29/2017 0356  BNP 1,248.0*     DDimer  Recent Labs  Lab 10/06/17 1130  DDIMER >20.00*     Radiology    Ct Head Wo Contrast  Result Date: 10/10/2017 CLINICAL DATA:  83 y/o F; unexplained altered level of consciousness. EXAM: CT HEAD WITHOUT CONTRAST TECHNIQUE: Contiguous axial images were obtained from the base of the skull through the vertex without intravenous contrast. COMPARISON:  None. FINDINGS: Brain: No evidence of acute infarction, hemorrhage, hydrocephalus, extra-axial collection or mass lesion/mass effect. Mild chronic microvascular ischemic changes and  parenchymal volume loss of the brain for age. Vascular: Axially atherosclerosis of carotid siphons. No hyperdense vessel. Skull: Normal. Negative for fracture or focal lesion. Sinuses/Orbits: No acute finding. Bilateral intra-ocular lens replacement. Other: None. IMPRESSION: 1. No acute intracranial abnormality identified. 2. Mild chronic microvascular ischemic changes and parenchymal volume loss of the brain for age. Electronically Signed   By: Lance  Furusawa-Stratton M.D.   On: 10/10/2017 15:48   Dg Chest Port 1 View  Result Date: 10/10/2017 CLINICAL DATA:  Respiratory failure EXAM: PORTABLE CHEST 1 VIEW COMPARISON:  10/09/2017 FINDINGS: Endotracheal tube in good position. NG tube in the stomach. Central venous catheter tip in the lower SVC unchanged. Diffuse bilateral airspace disease unchanged from the prior study. Small pleural effusions. Negative for pneumothorax. IMPRESSION: Support lines remain in good position Extensive bilateral airspace disease unchanged. Small bilateral effusions. Electronically Signed   By: Charles  Clark M.D.   On: 10/10/2017 07:45    Cardiac Studies   Study Conclusions  - Left ventricle: The cavity size was normal. Wall thickness was normal. Systolic function was normal. The estimated ejection fraction was in the range of 55% to 60%. Features are consistent with a pseudonormal left ventricular filling pattern, with concomitant abnormal relaxation and increased filling pressure (grade 2 diastolic dysfunction). - Aortic valve: Cusp separation was reduced. There was mild to moderate stenosis. Valve area (VTI): 0.85 cm^2. Valve area (Vmax): 0.95 cm^2. Valve area (Vmean): 0.94 cm^2. - Mitral valve: Calcified annulus. Mobility of the posterior leaflet was restricted. There was mild regurgitation. - Left atrium: The atrium was moderately dilated. - Right ventricle: The cavity size was moderately dilated. Wall thickness was normal. - Right atrium:  The atrium was moderately dilated. - Tricuspid valve: There was severe regurgitation directed centrally. - Pulmonary arteries: PA peak pressure: 71 mm Hg (S).    Patient Profile   Nichole Padilla is a 83 y.o. female with a hx recently diagnosed atrial fibrillation, GERD, upper GI bleeding, CKD stage III-IV, HTN and HLD of who is admitted 11/24 for acute hypoxic respiratory failure with PNA requiring emergent intubation.   Admitted 11/18-11/21 for HCAP. Hospital course complicated   by new onset atrial fibrillation. Echo showed normal LVEF. The patient was not started on anticoagalation given recent upper GI bleed (admitted at [place]) and hx of cirrhosis after discussion with PCP by attending.   Assessment & Plan    1. Paroxysmal atrial fibrillation 2. Acute diastolic CHF 3. Acute on chronic kidney diease, stage III-IV 4. HTN  83 y.o. female with a hx recently diagnosed atrial fibrillation, GERD, upper GI bleeding, CKD stage III-IV, HTN and HLD of who is being seen today for the evaluation of CHF and afib. Admitted 11/18-11/21 for HCAP. Hospital course complicated by new onset atrial fibrillation. Echo showed normal LVEF, grade 2 diastolic dysfunction. Mild to moderate aortic stenosis, mild MR, severe TR and severe pulmonary hypertension.  D-dimer > 20. No DVT on LE doppler.  The patient was not started on anticoagulation given recent upper GI bleed (admitted at [place]) and hx of cirrhosis after discussion with PCP by attending. She was readmitted on 11/24 for worsening shortness of breath, CT chest today shows multilobar pneumonia, mild CHF. The patient developed acute respiratory failure and was intubated, on iv ATB She has been receiving iv Lasix with good diuresis but worsening Crea 2.1--> 3.0-->3.25-->4.29, Lasix was held.  She cardioverted to SR, but went to an atrial flutter with variable block this morning, ventricular rates up to 150. BP is elevated, I will start cardizem  drip at 10 mg/hr and uptitrate as tolerated by BP.     , MD 10/11/2017  

## 2017-10-12 DEATH — deceased

## 2017-10-23 ENCOUNTER — Telehealth: Payer: Self-pay

## 2017-10-23 NOTE — Telephone Encounter (Signed)
On 10/23/2017 I [facility] a d[facility]c from [facility] ([facility] The d/c is for burial. The patient is a patient of Doctor Tyson AliasFeinstein. [facility].  On 10/28/17 I received the d/c back from Doctor Craige CottaSood who signed the d/c for Tyson AliasFeinstein. I got the d/c ready and called [facility] to let them know the d/c was mailed to them.

## 2017-11-12 NOTE — Progress Notes (Signed)
Pt asystolic and apneic.  Time of death 0615, pronounced by Heber Carolinaonda Davis RN and Wallis BambergJennifer Kauan Kloosterman RN.  Dr. Jamison NeighborNestor made aware.

## 2017-11-12 NOTE — Death Summary Note (Signed)
Nita SellsConstance Gieger was a 84 y.o. female admitted on 10/03/2017 with shortness of breath, cough and fatigue after recent admission for pneumonia and atrial fibrillation.  She was noted have hypoxia.  There was also concern for acute pulmonary edema.  She required intubation.  Family opted for DNR status and transition to comfort measures.  She was extubated on 10/11/17.  She expired on 06-22-17 at 6:15 AM.  Final diagnoses: Acute hypoxic respiratory failure Acute pulmonary edema Acute diastolic CHF E coli pneumonia Atrial fibrillation with rapid ventricular response History of upper GI bleeding Acute renal failure with ATN Stage 4 CKD WHO group 2 pulmonary hypertension Aortic stenosis Hypokalemia Anemia of critical illness and chronic disease Hyperglycemia Acute metabolic encephalopathy  Coralyn HellingVineet Jakaylee Sasaki, MD [facility] 10/29/2017, 3:06 PM

## 2017-11-12 DEATH — deceased

## 2018-10-26 IMAGING — CT CT CHEST W/O CM
2 of 4 series · 15 of 36 positions shown, 18 images · non-contrast
Comparison: CT chest 09/29/2017, chest x-ray 10/07/2017

CLINICAL DATA: Acute respiratory failure.

EXAM:
CT CHEST WITHOUT CONTRAST
TECHNIQUE: Multidetector CT imaging of the chest was performed following the
standard protocol without IV contrast.

[Series 2: thorax · axial · 0.71mm/px · z∈[+1306,+1546]mm · 12 of 142 slices shown, 15 images]
[im 11/142  mediastinal]
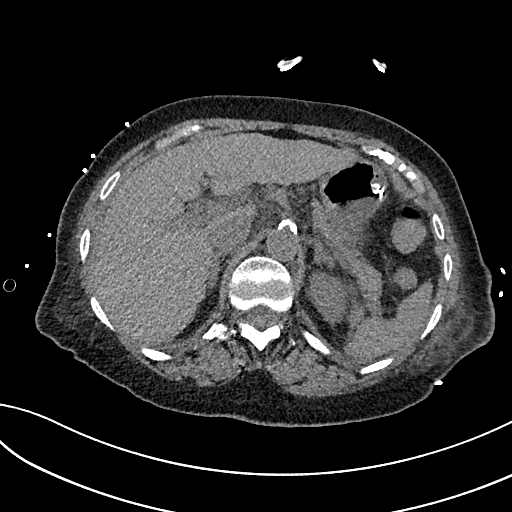
[im 11/142  lung]
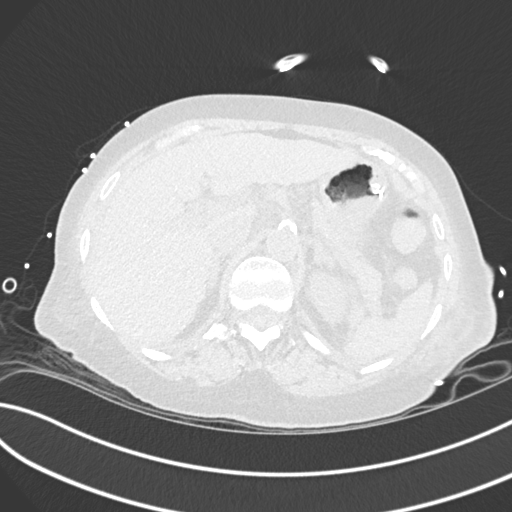
[im 21/142  lung]
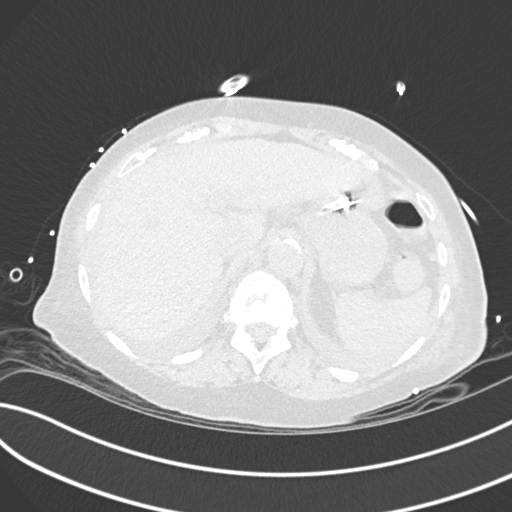
[im 31/142  lung]
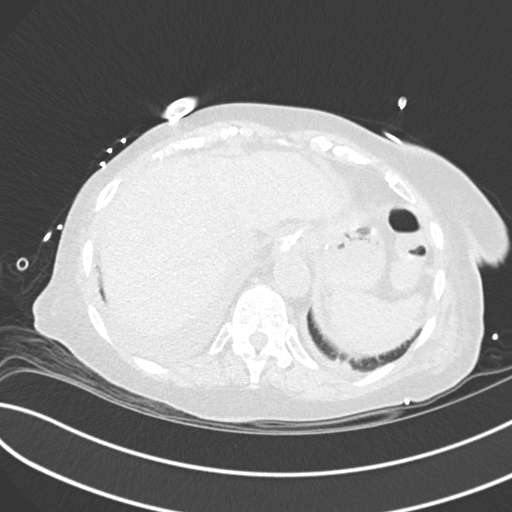
[im 41/142  lung]
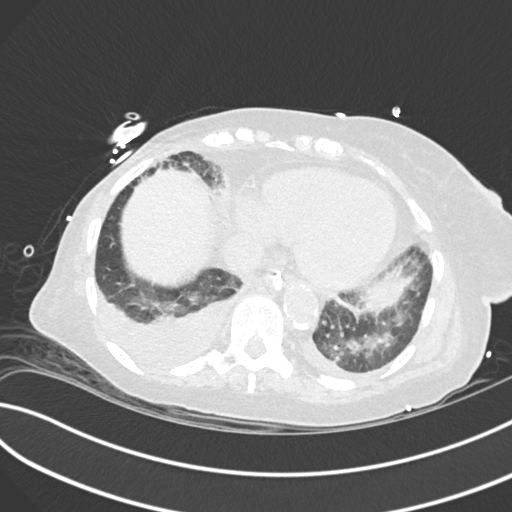
[im 51/142  mediastinal]
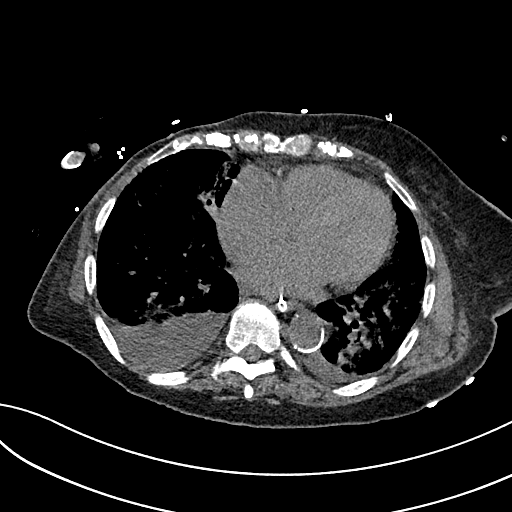
[im 51/142  lung]
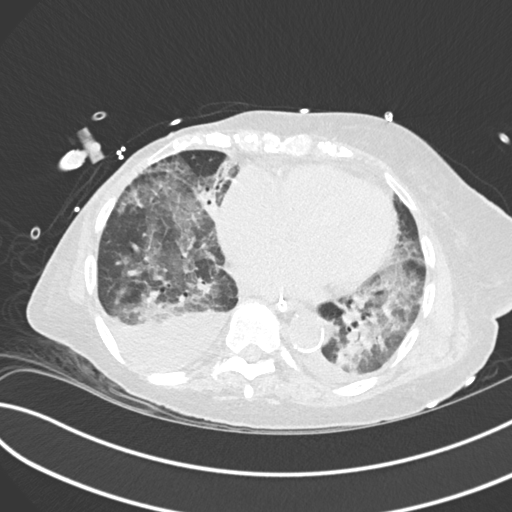
[im 61/142  lung]
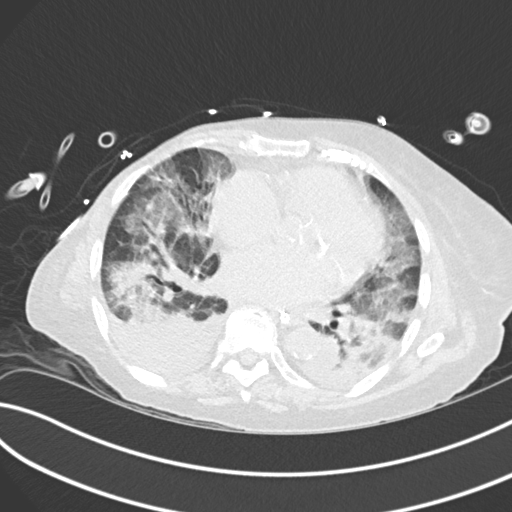
[im 81/142  lung]
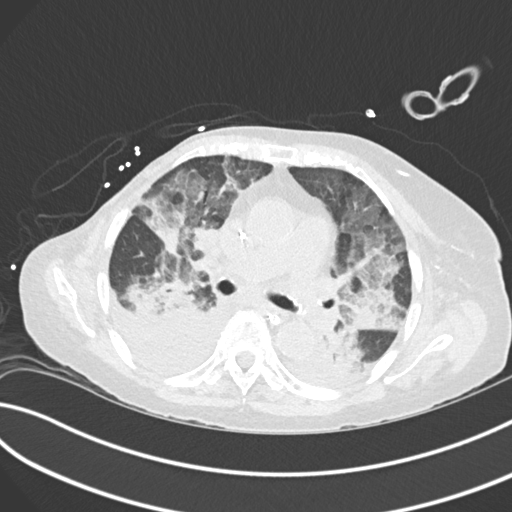
[im 91/142  lung]
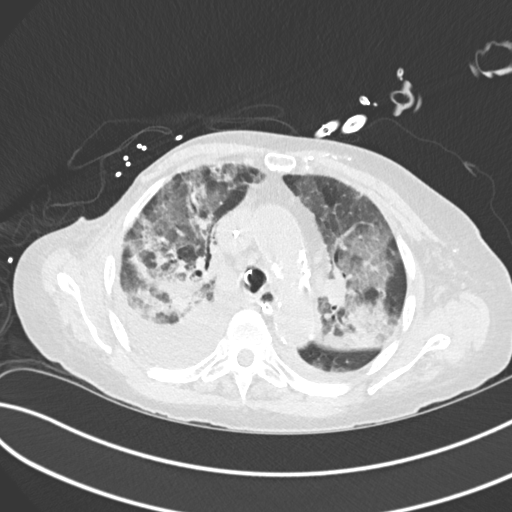
[im 101/142  mediastinal]
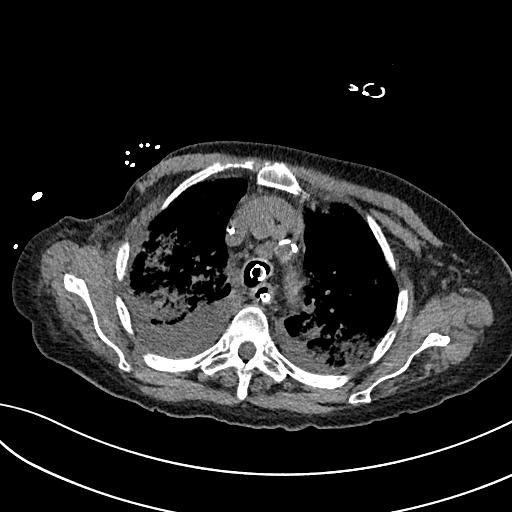
[im 101/142  lung]
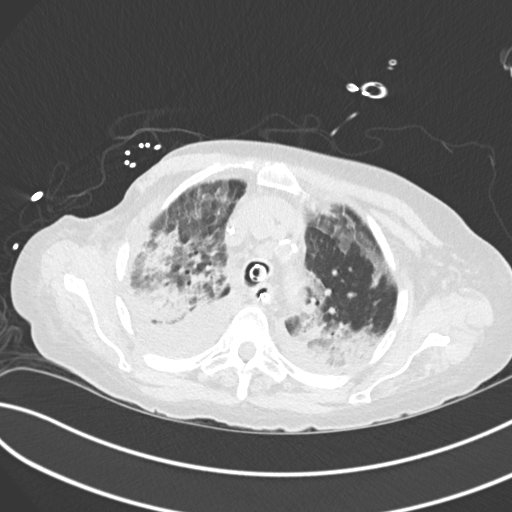
[im 111/142  lung]
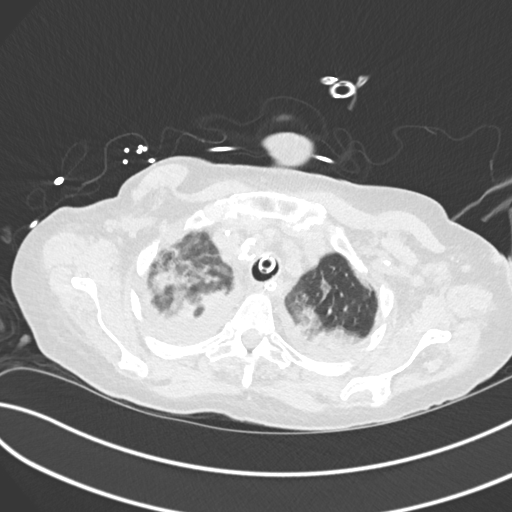
[im 121/142  lung]
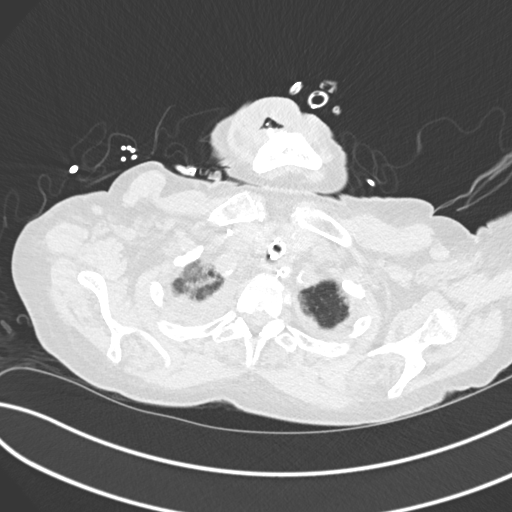
[im 131/142  lung]
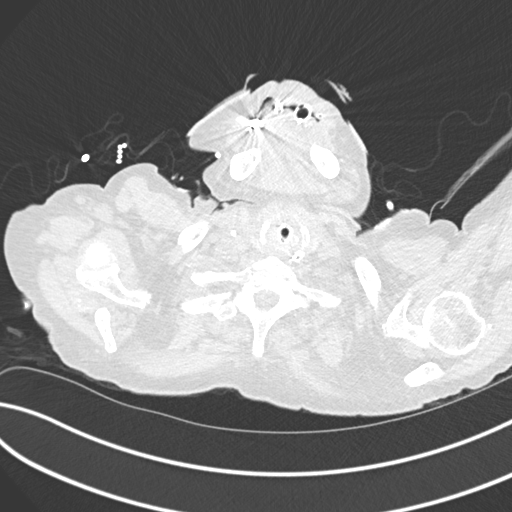

[Series 5: coronal · coronal · 0.57mm/px · 3 of 113 slices shown]
[im 23/113  lung]
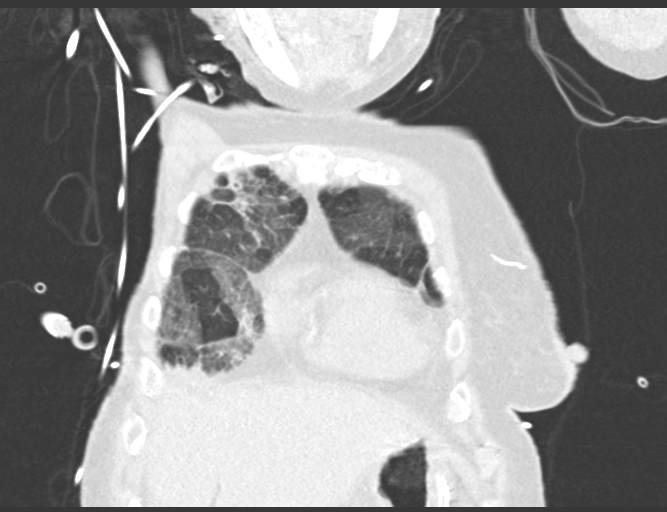
[im 45/113  lung]
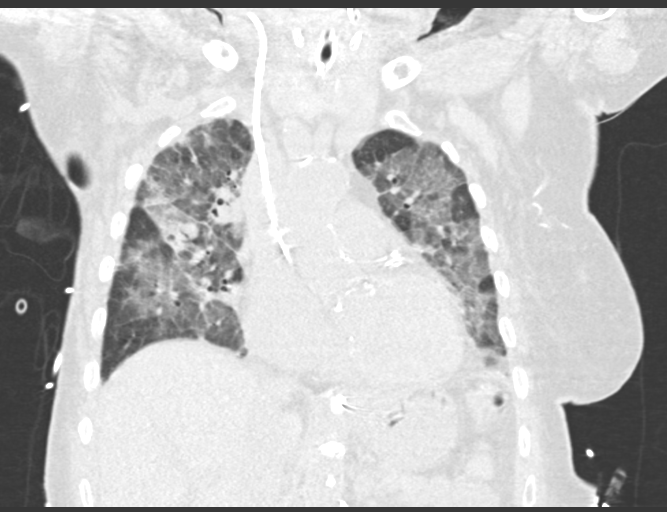
[im 68/113  lung]
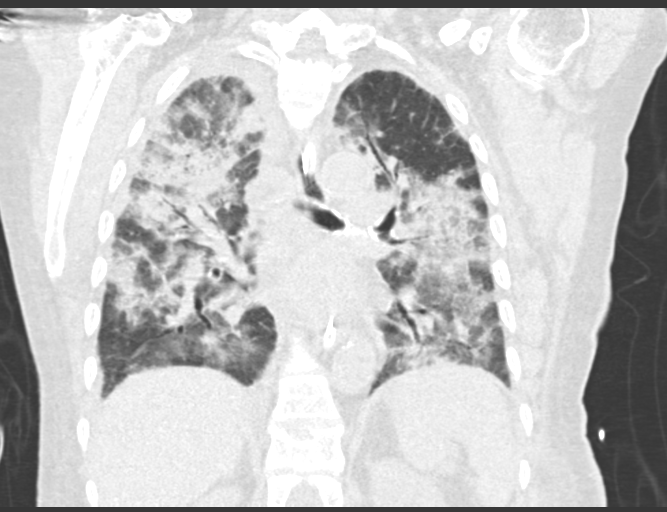

[15 of 36 positions shown; findings below may reference images not displayed]

FINDINGS: Cardiovascular: No significant vascular findings. Normal heart size.
No pericardial effusion. Coronary artery atherosclerosis involving
the LAD, circumflex and RCA. Thoracic aortic atherosclerosis.

Mediastinum/Nodes: Enlarged subcarinal lymph node measuring 17 mm.
Thyroid gland, trachea, and esophagus demonstrate no significant
findings.

Lungs/Pleura: Endotracheal tube with the tip 8 mm above the carina.
Small bilateral pleural effusions, right greater than left.
Bilateral patchy areas of ground-glass opacity and consolidation
involving the upper and lower lobes.

Upper Abdomen: No acute upper abdominal abnormality.

Musculoskeletal: No acute osseous abnormality. No lytic or sclerotic
osseous lesion. Chronic T4, T5 and T12 vertebral body compression
fractures.
IMPRESSION: 1. Progression of bilateral patchy areas of ground-glass opacity and
consolidation involving the upper and lower lobes most concerning
for multilobar pneumonia. An element of pulmonary edema is not
excluded.
2. Endotracheal tube with the tip 8 mm above the carina. Recommend
retracting the endotracheal tube 1-2 cm.
3.  Aortic Atherosclerosis ([id number])

## 2018-10-27 IMAGING — DX DG CHEST 1V PORT
1 series · 1 of 1 positions shown · non-contrast
Comparison: 10/07/2017

CLINICAL DATA: Acute respiratory acidosis

EXAM:
PORTABLE CHEST 1 VIEW

[chest ap]
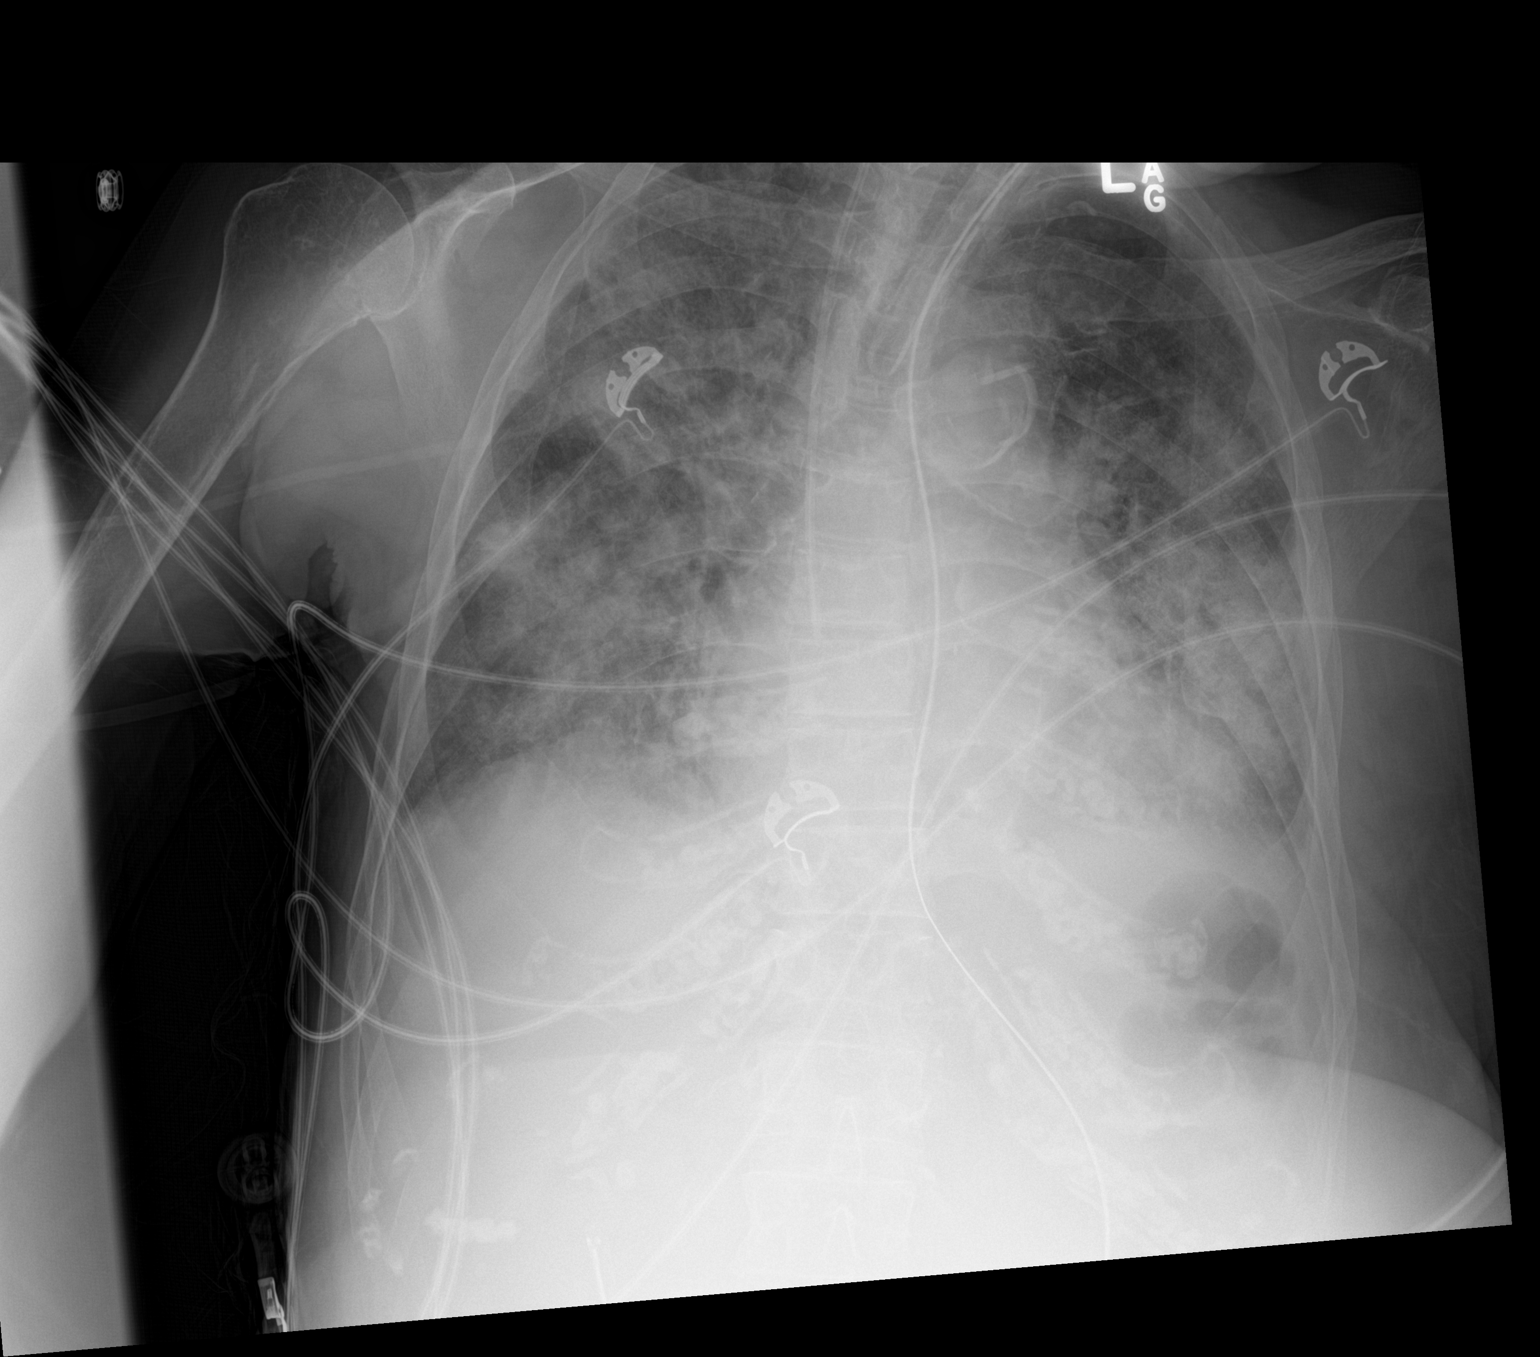

[1 of 1 positions shown; findings below may reference images not displayed]

FINDINGS: Endotracheal tube 2.5 cm above the carina unchanged in position.
Central venous catheter tip in the cavoatrial junction unchanged.
Gastric tube enters the stomach.

Diffuse bilateral airspace disease with mild interval improvement.
Minimal left effusion. No pneumothorax
IMPRESSION: Diffuse bilateral airspace disease with mild interval improvement.
